# Patient Record
Sex: Male | Born: 1935 | Race: White | Hispanic: No | Marital: Married | Smoking: Former smoker
Health system: Southern US, Community
[De-identification: ages and names within clinical notes are randomized; demographics above are authoritative.]

## PROBLEM LIST (undated history)

## (undated) DIAGNOSIS — Z7901 Long term (current) use of anticoagulants: Secondary | ICD-10-CM

## (undated) DIAGNOSIS — I480 Paroxysmal atrial fibrillation: Secondary | ICD-10-CM

## (undated) DIAGNOSIS — C20 Malignant neoplasm of rectum: Secondary | ICD-10-CM

## (undated) DIAGNOSIS — T3390XA Superficial frostbite of unspecified sites, initial encounter: Secondary | ICD-10-CM

## (undated) HISTORY — PX: HERNIA REPAIR: SHX51

## (undated) HISTORY — DX: Malignant neoplasm of rectum: C20

## (undated) HISTORY — DX: Superficial frostbite of unspecified sites, initial encounter: T33.90XA

## (undated) HISTORY — DX: Paroxysmal atrial fibrillation: I48.0

## (undated) HISTORY — DX: Long term (current) use of anticoagulants: Z79.01

## (undated) HISTORY — PX: OTHER SURGICAL HISTORY: SHX169

---

## 1999-11-04 ENCOUNTER — Ambulatory Visit (HOSPITAL_COMMUNITY): Admission: RE | Admit: 1999-11-04 | Discharge: 1999-11-04 | Payer: Self-pay | Admitting: Endocrinology

## 1999-11-04 ENCOUNTER — Encounter: Payer: Self-pay | Admitting: Endocrinology

## 2001-02-19 ENCOUNTER — Ambulatory Visit (HOSPITAL_COMMUNITY): Admission: RE | Admit: 2001-02-19 | Discharge: 2001-02-19 | Payer: Self-pay | Admitting: Internal Medicine

## 2002-03-12 ENCOUNTER — Ambulatory Visit (HOSPITAL_COMMUNITY): Admission: RE | Admit: 2002-03-12 | Discharge: 2002-03-12 | Payer: Self-pay | Admitting: General Surgery

## 2002-06-26 ENCOUNTER — Ambulatory Visit (HOSPITAL_COMMUNITY): Admission: RE | Admit: 2002-06-26 | Discharge: 2002-06-26 | Payer: Self-pay | Admitting: Ophthalmology

## 2002-07-09 ENCOUNTER — Ambulatory Visit (HOSPITAL_COMMUNITY): Admission: RE | Admit: 2002-07-09 | Discharge: 2002-07-09 | Payer: Self-pay | Admitting: Ophthalmology

## 2002-10-29 ENCOUNTER — Encounter: Payer: Self-pay | Admitting: *Deleted

## 2002-10-29 ENCOUNTER — Encounter (HOSPITAL_COMMUNITY): Admission: RE | Admit: 2002-10-29 | Discharge: 2002-11-28 | Payer: Self-pay | Admitting: *Deleted

## 2003-02-17 ENCOUNTER — Ambulatory Visit (HOSPITAL_COMMUNITY): Admission: RE | Admit: 2003-02-17 | Discharge: 2003-02-17 | Payer: Self-pay | Admitting: *Deleted

## 2004-05-11 ENCOUNTER — Ambulatory Visit: Payer: Self-pay | Admitting: *Deleted

## 2004-06-11 ENCOUNTER — Ambulatory Visit: Payer: Self-pay | Admitting: Cardiology

## 2004-06-30 ENCOUNTER — Ambulatory Visit: Payer: Self-pay | Admitting: Cardiology

## 2004-07-13 ENCOUNTER — Ambulatory Visit: Payer: Self-pay | Admitting: Internal Medicine

## 2004-08-11 ENCOUNTER — Ambulatory Visit: Payer: Self-pay | Admitting: Cardiology

## 2004-09-13 ENCOUNTER — Ambulatory Visit: Payer: Self-pay | Admitting: *Deleted

## 2004-10-11 ENCOUNTER — Ambulatory Visit: Payer: Self-pay | Admitting: *Deleted

## 2004-11-08 ENCOUNTER — Ambulatory Visit: Payer: Self-pay | Admitting: *Deleted

## 2004-12-08 ENCOUNTER — Ambulatory Visit: Payer: Self-pay | Admitting: *Deleted

## 2004-12-15 ENCOUNTER — Ambulatory Visit: Payer: Self-pay | Admitting: Cardiology

## 2005-01-12 ENCOUNTER — Ambulatory Visit: Payer: Self-pay | Admitting: Cardiology

## 2005-02-09 ENCOUNTER — Ambulatory Visit: Payer: Self-pay | Admitting: *Deleted

## 2005-03-01 ENCOUNTER — Ambulatory Visit: Payer: Self-pay | Admitting: Cardiology

## 2005-03-09 ENCOUNTER — Ambulatory Visit: Payer: Self-pay | Admitting: Cardiology

## 2005-03-18 ENCOUNTER — Ambulatory Visit: Payer: Self-pay

## 2005-04-06 ENCOUNTER — Ambulatory Visit: Payer: Self-pay | Admitting: *Deleted

## 2005-05-16 ENCOUNTER — Ambulatory Visit: Payer: Self-pay | Admitting: Cardiology

## 2005-06-14 ENCOUNTER — Ambulatory Visit: Payer: Self-pay | Admitting: *Deleted

## 2005-07-19 ENCOUNTER — Ambulatory Visit: Payer: Self-pay | Admitting: Cardiology

## 2005-08-22 ENCOUNTER — Ambulatory Visit: Payer: Self-pay | Admitting: *Deleted

## 2005-09-19 ENCOUNTER — Ambulatory Visit: Payer: Self-pay | Admitting: Cardiology

## 2005-10-05 ENCOUNTER — Ambulatory Visit: Payer: Self-pay | Admitting: Cardiology

## 2005-10-25 ENCOUNTER — Ambulatory Visit: Payer: Self-pay | Admitting: *Deleted

## 2005-11-22 ENCOUNTER — Ambulatory Visit: Payer: Self-pay | Admitting: *Deleted

## 2006-01-20 ENCOUNTER — Ambulatory Visit: Payer: Self-pay | Admitting: Cardiology

## 2006-02-14 ENCOUNTER — Ambulatory Visit: Payer: Self-pay | Admitting: Cardiology

## 2006-03-20 ENCOUNTER — Ambulatory Visit: Payer: Self-pay | Admitting: Cardiology

## 2006-03-27 ENCOUNTER — Ambulatory Visit: Payer: Self-pay | Admitting: Cardiology

## 2006-05-30 ENCOUNTER — Ambulatory Visit: Payer: Self-pay | Admitting: Cardiology

## 2006-06-27 ENCOUNTER — Ambulatory Visit: Payer: Self-pay | Admitting: Cardiology

## 2006-08-01 ENCOUNTER — Ambulatory Visit: Payer: Self-pay | Admitting: Cardiovascular Disease

## 2006-08-31 ENCOUNTER — Ambulatory Visit: Payer: Self-pay | Admitting: Internal Medicine

## 2006-09-28 ENCOUNTER — Ambulatory Visit: Payer: Self-pay | Admitting: Internal Medicine

## 2006-10-06 ENCOUNTER — Ambulatory Visit: Payer: Self-pay | Admitting: Cardiology

## 2006-11-03 ENCOUNTER — Ambulatory Visit: Payer: Self-pay | Admitting: Internal Medicine

## 2006-11-10 ENCOUNTER — Ambulatory Visit: Payer: Self-pay | Admitting: Cardiology

## 2006-12-26 ENCOUNTER — Ambulatory Visit: Payer: Self-pay | Admitting: Cardiology

## 2007-01-23 ENCOUNTER — Ambulatory Visit: Payer: Self-pay | Admitting: Cardiology

## 2007-02-20 ENCOUNTER — Ambulatory Visit: Payer: Self-pay | Admitting: Cardiology

## 2007-03-14 ENCOUNTER — Ambulatory Visit: Payer: Self-pay | Admitting: Cardiology

## 2007-04-13 ENCOUNTER — Ambulatory Visit: Payer: Self-pay | Admitting: Cardiology

## 2007-05-14 ENCOUNTER — Ambulatory Visit: Payer: Self-pay | Admitting: Cardiology

## 2007-06-14 ENCOUNTER — Ambulatory Visit: Payer: Self-pay | Admitting: Cardiology

## 2007-07-16 ENCOUNTER — Ambulatory Visit: Payer: Self-pay | Admitting: Cardiology

## 2007-08-15 ENCOUNTER — Ambulatory Visit: Payer: Self-pay | Admitting: Cardiovascular Disease

## 2007-09-11 ENCOUNTER — Ambulatory Visit: Payer: Self-pay | Admitting: Cardiology

## 2007-09-11 ENCOUNTER — Ambulatory Visit: Payer: Self-pay | Admitting: Internal Medicine

## 2007-09-26 ENCOUNTER — Ambulatory Visit: Payer: Self-pay | Admitting: Cardiology

## 2007-10-26 ENCOUNTER — Ambulatory Visit: Payer: Self-pay | Admitting: Cardiology

## 2007-11-27 ENCOUNTER — Ambulatory Visit: Payer: Self-pay | Admitting: Cardiology

## 2007-12-25 ENCOUNTER — Ambulatory Visit: Payer: Self-pay | Admitting: Cardiology

## 2008-01-15 ENCOUNTER — Ambulatory Visit: Payer: Self-pay | Admitting: Cardiology

## 2008-02-19 ENCOUNTER — Ambulatory Visit: Payer: Self-pay | Admitting: Cardiology

## 2008-03-19 ENCOUNTER — Ambulatory Visit: Payer: Self-pay | Admitting: Cardiology

## 2008-04-17 ENCOUNTER — Ambulatory Visit: Payer: Self-pay | Admitting: Cardiology

## 2008-05-15 ENCOUNTER — Ambulatory Visit: Payer: Self-pay | Admitting: Cardiology

## 2008-06-16 ENCOUNTER — Ambulatory Visit: Payer: Self-pay | Admitting: Cardiology

## 2008-07-14 ENCOUNTER — Ambulatory Visit: Payer: Self-pay | Admitting: Cardiology

## 2008-10-29 ENCOUNTER — Ambulatory Visit: Payer: Self-pay | Admitting: Cardiology

## 2008-11-06 ENCOUNTER — Ambulatory Visit: Payer: Self-pay | Admitting: Cardiology

## 2008-11-20 ENCOUNTER — Ambulatory Visit: Payer: Self-pay | Admitting: Cardiology

## 2008-11-27 ENCOUNTER — Ambulatory Visit: Payer: Self-pay | Admitting: Cardiology

## 2008-12-04 ENCOUNTER — Ambulatory Visit: Payer: Self-pay | Admitting: Cardiology

## 2008-12-18 ENCOUNTER — Ambulatory Visit: Payer: Self-pay | Admitting: Cardiology

## 2009-01-08 ENCOUNTER — Ambulatory Visit: Payer: Self-pay | Admitting: Cardiology

## 2009-02-18 ENCOUNTER — Ambulatory Visit: Payer: Self-pay | Admitting: Cardiology

## 2009-02-23 ENCOUNTER — Encounter: Payer: Self-pay | Admitting: *Deleted

## 2009-03-01 ENCOUNTER — Ambulatory Visit: Payer: Self-pay | Admitting: Cardiology

## 2009-03-01 ENCOUNTER — Inpatient Hospital Stay (HOSPITAL_COMMUNITY): Admission: EM | Admit: 2009-03-01 | Discharge: 2009-03-04 | Payer: Self-pay | Admitting: Emergency Medicine

## 2009-03-19 ENCOUNTER — Telehealth: Payer: Self-pay | Admitting: Cardiology

## 2009-03-19 ENCOUNTER — Ambulatory Visit: Payer: Self-pay | Admitting: Cardiovascular Disease

## 2009-03-19 ENCOUNTER — Encounter: Payer: Self-pay | Admitting: Cardiology

## 2009-03-19 LAB — CONVERTED CEMR LAB
HCT: 36 %
Hemoglobin: 11.8 g/dL
MCV: 97.3 fL
Platelets: 199 10*3/uL
WBC: 5.4 10*3/uL

## 2009-03-31 DIAGNOSIS — N35919 Unspecified urethral stricture, male, unspecified site: Secondary | ICD-10-CM | POA: Insufficient documentation

## 2009-03-31 DIAGNOSIS — I10 Essential (primary) hypertension: Secondary | ICD-10-CM

## 2009-03-31 DIAGNOSIS — I4891 Unspecified atrial fibrillation: Secondary | ICD-10-CM | POA: Insufficient documentation

## 2009-03-31 DIAGNOSIS — T3390XA Superficial frostbite of unspecified sites, initial encounter: Secondary | ICD-10-CM | POA: Insufficient documentation

## 2009-03-31 DIAGNOSIS — C20 Malignant neoplasm of rectum: Secondary | ICD-10-CM

## 2009-04-01 ENCOUNTER — Ambulatory Visit: Payer: Self-pay | Admitting: Cardiology

## 2009-09-23 ENCOUNTER — Ambulatory Visit: Payer: Self-pay | Admitting: Cardiology

## 2009-10-19 ENCOUNTER — Telehealth: Payer: Self-pay | Admitting: Cardiology

## 2010-05-04 ENCOUNTER — Ambulatory Visit: Payer: Self-pay | Admitting: Cardiology

## 2010-07-07 ENCOUNTER — Telehealth: Payer: Self-pay | Admitting: Cardiology

## 2010-08-10 NOTE — Assessment & Plan Note (Signed)
Summary: 6 mth f/u per checkout on 04/01/09/tg      Allergies Added: NKDA  Visit Type:  Follow-up Primary Provider:  Nehemiah Settle  CC:  no cardiology complaints.  History of Present Illness: Andre Johnson returns today for evaluation and management of his paroxysmal atrial fibrillation. She's had no recurrent symptoms of palpitations, rapid heart beat, presyncope or syncope.  He is no longer on Coumadin because of hematuria which was quite severe with a therapeutic and are 2.5. Extensive evaluation the hospital found no etiology.  His wife takes great care of him and does not want him on Coumadin at all cost. Please are per my previous note.  Current Medications (verified): 1)  Cardizem Cd 240 Mg Xr24h-Cap (Diltiazem Hcl Coated Beads) .... Take 1 Tab Daily 2)  Aspirin Ec 325 Mg Tbec (Aspirin) .... Take One Tablet By Mouth Daily  Allergies (verified): No Known Drug Allergies  Past History:  Past Medical History: Last updated: 04-09-09 Current Problems:  FROSTBITE (ICD-991.3) CARCINOMA, RECTUM (ICD-154.1) HYPERTENSION, BENIGN ESSENTIAL (ICD-401.1) ATRIAL FIBRILLATION, PAROXYSMAL (ICD-427.31) COUMADIN THERAPY (ICD-V58.61) STRICTURE, URETHRAL (ICD-598.9)  Past Surgical History: Last updated: 04/09/2009 colon polpys removed hernia repair  Family History: Last updated: 04-09-09 Father:deceased cause unknown Mother:deceased cause unknown Siblings:  Social History: Last updated: 2009-04-09 Retired  Married  Tobacco Use - Former.  Alcohol Use - no Regular Exercise - no Drug Use - no  Risk Factors: Exercise: no (04/09/2009)  Risk Factors: Smoking Status: quit (04-09-09)  Review of Systems       negative other than history of present illness  Vital Signs:  Patient profile:   77 year old male Height:      68 inches Weight:      111 pounds Pulse rate:   65 / minute BP sitting:   133 / 77  (right arm)  Vitals Entered By: Dreama Saa, CNA (September 23, 2009 3:08 PM)  Physical Exam  General:  Well developed, well nourished, in no acute distress. Head:  normocephalic and atraumatic Eyes:  PERRLA/EOM intact; conjunctiva and lids normal. Neck:  Neck supple, no JVD. No masses, thyromegaly or abnormal cervical nodes. Chest Andre Johnson:  no deformities or breast masses noted Lungs:  Clear bilaterally to auscultation and percussion. Heart:  regular rate and rhythm, normal S1-S2, PMI nondisplaced Msk:  Back normal, normal gait. Muscle strength and tone normal. Pulses:  pulses normal in all 4 extremities Extremities:  No clubbing or cyanosis. Neurologic:  Baseline Skin:  Intact without lesions or rashes. Psych:  Normal affect.   Impression & Recommendations:  Problem # 1:  ATRIAL FIBRILLATION, PAROXYSMAL (ICD-427.31) Assessment Unchanged  His updated medication list for this problem includes:    Aspirin Ec 325 Mg Tbec (Aspirin) .Marland Kitchen... Take one tablet by mouth daily  Problem # 2:  HYPERTENSION, BENIGN ESSENTIAL (ICD-401.1) Assessment: Improved  His updated medication list for this problem includes:    Cardizem Cd 240 Mg Xr24h-cap (Diltiazem hcl coated beads) .Marland Kitchen... Take 1 tab daily    Aspirin Ec 325 Mg Tbec (Aspirin) .Marland Kitchen... Take one tablet by mouth daily  Patient Instructions: 1)  Your physician recommends that you schedule a follow-up appointment in: 6 months 2)  Your physician recommends that you continue on your current medications as directed. Please refer to the Current Medication list given to you today.

## 2010-08-10 NOTE — Assessment & Plan Note (Signed)
Summary: f33m      Allergies Added: NKDA  Visit Type:  Follow-up Primary Andre Johnson:  Andre Johnson  CC:  no cardiology complaints.  History of Present Illness: Andre Johnson comes in today for management of his atrial circulation.  He has no complaints including no chest pain, palpitations, shortness of breath, edema, or syncope.  Current Medications (verified): 1)  Cardizem Cd 240 Mg Xr24h-Cap (Diltiazem Hcl Coated Beads) .... Take 1 Tab Daily 2)  Aspirin Ec 325 Mg Tbec (Aspirin) .... Take One Tablet By Mouth Daily  Allergies (verified): No Known Drug Allergies  Comments:  Nurse/Medical Assistant: per last ov patient states meds are the same only cardizem and asa   Past History:  Past Medical History: Last updated: April 21, 2009 Current Problems:  FROSTBITE (ICD-991.3) CARCINOMA, RECTUM (ICD-154.1) HYPERTENSION, BENIGN ESSENTIAL (ICD-401.1) ATRIAL FIBRILLATION, PAROXYSMAL (ICD-427.31) COUMADIN THERAPY (ICD-V58.61) STRICTURE, URETHRAL (ICD-598.9)  Past Surgical History: Last updated: 04/21/2009 colon polpys removed hernia repair  Family History: Last updated: April 21, 2009 Father:deceased cause unknown Mother:deceased cause unknown Siblings:  Social History: Last updated: 2009-04-21 Retired  Married  Tobacco Use - Former.  Alcohol Use - no Regular Exercise - no Drug Use - no  Risk Factors: Exercise: no (04-21-09)  Risk Factors: Smoking Status: quit (04-21-09)  Review of Systems       negative other than history of present illness  Vital Signs:  Patient profile:   77 year old male Weight:      111 pounds BMI:     16.94 Pulse rate:   57 / minute BP sitting:   136 / 61  (right arm)  Vitals Entered By: Dreama Saa, CNA (May 04, 2010 10:07 AM)  Physical Exam  General:  Well developed, well nourished, in no acute distress. Head:  normocephalic and atraumatic Neck:  Neck supple, no JVD. No masses, thyromegaly or abnormal cervical  nodes. Chest Wall:  no deformities or breast masses noted Lungs:  Clear bilaterally to auscultation and percussion. Heart:  PMI nondisplaced, regular rate and rhythm, bearable S1-S2, no carotid bruits Msk:  decreased ROM.   Pulses:  pulses normal in all 4 extremities Extremities:  No clubbing or cyanosis. Neurologic:  Alert and oriented x 3. Skin:  Intact without lesions or rashes. Psych:  Normal affect.   Impression & Recommendations:  Problem # 1:  ATRIAL FIBRILLATION, PAROXYSMAL (ICD-427.31) Assessment Unchanged He is in atrial fibrillation today. His wife does not want him on anticoagulation. We'll continue with aspirin and rate control. He is asymptomatic. His updated medication list for this problem includes:    Aspirin Ec 325 Mg Tbec (Aspirin) .Marland Kitchen... Take one tablet by mouth daily  Problem # 2:  HYPERTENSION, BENIGN ESSENTIAL (ICD-401.1) Assessment: Improved  His updated medication list for this problem includes:    Cardizem Cd 240 Mg Xr24h-cap (Diltiazem hcl coated beads) .Marland Kitchen... Take 1 tab daily    Aspirin Ec 325 Mg Tbec (Aspirin) .Marland Kitchen... Take one tablet by mouth daily  Patient Instructions: 1)  Your physician recommends that you schedule a follow-up appointment in: 6 months 2)  Your physician recommends that you continue on your current medications as directed. Please refer to the Current Medication list given to you today.

## 2010-08-10 NOTE — Progress Notes (Signed)
Summary: speak to nurse   Phone Note Call from Patient Call back at Home Phone [phone]   Caller: Patient Reason for Call: Talk to Nurse Summary of Call: request to speak to nurse... has some questions Initial call taken by: Migdalia Dk,  October 19, 2009 1:28 PM  Follow-up for Phone Call        PER WIFE NEEDED REFILL ON DILTIAZEM  DONE THORUGH EMR WIFE AWARE. Follow-up by: Scherrie Bateman, LPN,  October 19, 2009 3:15 PM    Prescriptions: CARDIZEM CD 240 MG XR24H-CAP (DILTIAZEM HCL COATED BEADS) take 1 tab daily  #30 x 11   Entered by:   Scherrie Bateman, LPN   Authorized by:   Gaylord Shih, MD, [facility]   Signed by:   Scherrie Bateman, LPN on 96/29/5284   Method used:   Electronically to        [facility] (retail)       [address]       [place], [place]  [address]       Ph: [phone] or [phone]       Fax: [phone]   RxID:   [id number]

## 2010-08-10 NOTE — Progress Notes (Signed)
Summary: refill   Phone Note Refill Request Message from:  Patient on October 19, 2009 9:16 AM  Refills Requested: Medication #1:  CARDIZEM CD 240 MG XR24H-CAP take 1 tab daily   Supply Requested: 1 year [facility] [id number]   Method Requested: Fax to Local Pharmacy Initial call taken by: Migdalia Dk,  October 19, 2009 9:16 AM Caller: Spouse Reason for Call: Talk to Nurse    Prescriptions: CARDIZEM CD 240 MG XR24H-CAP (DILTIAZEM HCL COATED BEADS) take 1 tab daily  #30 x 1   Entered by:   Danielle Rankin, CMA   Authorized by:   Gaylord Shih, MD, [facility]   Signed by:   Teressa Lower RN on 10/19/2009   Method used:   Electronically to        [facility] (retail)       [address]       [place], [place]  [address]       Ph: [phone] or [phone]       Fax: [phone]   RxID:   [id number]

## 2010-08-12 NOTE — Progress Notes (Signed)
Summary: question on meds   Phone Note Call from Patient   Caller: Spouse/alma Reason for Call: Talk to Nurse Summary of Call: pt wife has question on meds Initial call taken by: Roe Coombs,  July 07, 2010 1:08 PM  Follow-up for Phone Call        Wife calls today b/c Andre Johnson passed blood in his urine on Monday.  He has had  no further episodes of hematuria  His last dose of aspirin 325mg  was Sunday 07/04/10.  He will await Dr. Daleen Squibb recommendations regarding Aspirin. Mylo Red RN  Additional Follow-up for Phone Call Additional follow up Details #1::        decrease ASA to 81mg /d, needs to let primary care physician know. Additional Follow-up by: Gaylord Shih, MD, [facility],  July 07, 2010 2:49 PM

## 2010-10-16 LAB — CBC
Hemoglobin: 12.8 g/dL — ABNORMAL LOW (ref 13.0–17.0)
Platelets: 148 10*3/uL — ABNORMAL LOW (ref 150–400)
RBC: 3.74 MIL/uL — ABNORMAL LOW (ref 4.22–5.81)
RDW: 13.5 % (ref 11.5–15.5)
WBC: 3.9 10*3/uL — ABNORMAL LOW (ref 4.0–10.5)

## 2010-10-16 LAB — URINALYSIS, ROUTINE W REFLEX MICROSCOPIC
Bilirubin Urine: NEGATIVE
Ketones, ur: NEGATIVE mg/dL
Leukocytes, UA: NEGATIVE
Nitrite: NEGATIVE
Specific Gravity, Urine: 1.02 (ref 1.005–1.030)
Urobilinogen, UA: 0.2 mg/dL (ref 0.0–1.0)
pH: 6 (ref 5.0–8.0)

## 2010-10-16 LAB — PROTIME-INR
INR: 1.3 (ref 0.00–1.49)
INR: 2 — ABNORMAL HIGH (ref 0.00–1.49)
INR: 2.5 — ABNORMAL HIGH (ref 0.00–1.49)
Prothrombin Time: 16.1 seconds — ABNORMAL HIGH (ref 11.6–15.2)
Prothrombin Time: 22.9 seconds — ABNORMAL HIGH (ref 11.6–15.2)

## 2010-10-16 LAB — HEMOGLOBIN AND HEMATOCRIT, BLOOD
HCT: 32.5 % — ABNORMAL LOW (ref 39.0–52.0)
Hemoglobin: 11.5 g/dL — ABNORMAL LOW (ref 13.0–17.0)

## 2010-10-16 LAB — URINE CULTURE
Colony Count: NO GROWTH
Culture: NO GROWTH

## 2010-10-16 LAB — DIFFERENTIAL
Basophils Absolute: 0 10*3/uL (ref 0.0–0.1)
Basophils Absolute: 0 10*3/uL (ref 0.0–0.1)
Basophils Relative: 1 % (ref 0–1)
Eosinophils Relative: 3 % (ref 0–5)
Lymphocytes Relative: 13 % (ref 12–46)
Lymphocytes Relative: 22 % (ref 12–46)
Lymphs Abs: 0.9 10*3/uL (ref 0.7–4.0)
Monocytes Absolute: 0.3 10*3/uL (ref 0.1–1.0)
Monocytes Relative: 5 % (ref 3–12)
Neutro Abs: 2.6 10*3/uL (ref 1.7–7.7)
Neutro Abs: 4.5 10*3/uL (ref 1.7–7.7)
Neutrophils Relative %: 66 % (ref 43–77)
Neutrophils Relative %: 79 % — ABNORMAL HIGH (ref 43–77)

## 2010-10-16 LAB — BASIC METABOLIC PANEL
Calcium: 8.5 mg/dL (ref 8.4–10.5)
Creatinine, Ser: 1.79 mg/dL — ABNORMAL HIGH (ref 0.4–1.5)
GFR calc Af Amer: 44 mL/min — ABNORMAL LOW (ref >60)
GFR calc non Af Amer: 37 mL/min — ABNORMAL LOW (ref >60)
Sodium: 139 mEq/L (ref 135–145)

## 2010-10-16 LAB — URINE MICROSCOPIC-ADD ON

## 2010-10-16 LAB — APTT: aPTT: 42 seconds — ABNORMAL HIGH (ref 24–37)

## 2010-10-26 ENCOUNTER — Other Ambulatory Visit: Payer: Self-pay | Admitting: *Deleted

## 2010-10-26 ENCOUNTER — Telehealth: Payer: Self-pay | Admitting: Cardiology

## 2010-10-26 MED ORDER — DILTIAZEM HCL ER BEADS 240 MG PO CP24: 240.0000 mg | ORAL_CAPSULE | Freq: Every day | ORAL | Status: DC

## 2010-10-26 NOTE — Telephone Encounter (Signed)
PT IS OUT OF DILTIAZEM AND NEEDS A NEW RX CALLED IN TO [facility]. PLEASE CALL PATIENT WHEN IT HAS BEEN DONE.

## 2010-11-12 ENCOUNTER — Ambulatory Visit (INDEPENDENT_AMBULATORY_CARE_PROVIDER_SITE_OTHER): Payer: Medicare Other | Admitting: Cardiology

## 2010-11-12 ENCOUNTER — Encounter: Payer: Self-pay | Admitting: Cardiology

## 2010-11-12 VITALS — BP 126/79 | HR 61 | Ht 66.0 in | Wt 110.0 lb

## 2010-11-12 DIAGNOSIS — I4891 Unspecified atrial fibrillation: Secondary | ICD-10-CM

## 2010-11-12 NOTE — Progress Notes (Signed)
   Patient ID: Andre Johnson, male    DOB: April 22, 1926, 77 y.o.   MRN: [id number]  HPI Andre Johnson returns for E and M of CAF. He continues to do well without complaint.   Review of Systems  All other systems reviewed and are negative.      Physical Exam  Nursing note and vitals reviewed. Constitutional: He is oriented to person, place, and time. He appears well-developed and well-nourished. No distress.  HENT:  Head: Normocephalic and atraumatic.  Eyes: EOM are normal. Pupils are equal, round, and reactive to light.  Neck: Neck supple. No JVD present. No tracheal deviation present. No thyromegaly present.  Cardiovascular: Normal rate, normal heart sounds and normal pulses.  An irregularly irregular rhythm present. PMI is not displaced.   Pulmonary/Chest: Effort normal and breath sounds normal.  Abdominal: Soft. Bowel sounds are normal.  Musculoskeletal: He exhibits no edema.  Neurological: He is alert and oriented to person, place, and time.       baseline  Skin: Skin is warm and dry.  Psychiatric: He has a normal mood and affect.

## 2010-11-12 NOTE — Assessment & Plan Note (Signed)
Stable, no change in treatment.

## 2010-11-12 NOTE — Patient Instructions (Signed)
Your physician recommends that you continue on your current medications as directed. Please refer to the Current Medication list given to you today.  Your physician recommends that you schedule a follow-up appointment in: 1 year  

## 2010-11-23 NOTE — Group Therapy Note (Signed)
NAMESTIRLING, ORTON                ACCOUNT NO.:  [id number]   MEDICAL RECORD NO.:  [id number]          PATIENT TYPE:  INP   LOCATION:  A329                          FACILITY:  [facility]   PHYSICIAN:  Edward L. Juanetta Gosling, M.D.DATE OF BIRTH:  07/23/1925   DATE OF PROCEDURE:  03/03/2009  DATE OF DISCHARGE:                                 PROGRESS NOTE   Mr. Rambert is overall better.  He looks like his urine is cleared now.  He has no new complaints.  Dr. Marvel Plan help is appreciated.   His exam this morning shows his temperature is 97.8, pulse 58,  respirations 12, blood pressure 115/64, O2 sats 97% on room air.  His  PT/INR is now back basically to normal.   ASSESSMENT:  He has gross hematuria.  He was on Coumadin.  He has had  paroxysmal atrial fibrillation and my plan would be to talk to Dr.  Jerre Simon about whether he is going to need to have a cystoscopy because we  could go ahead and do that now.  He may be able to come off Coumadin in  the future, and I will discuss that with Dr. Dietrich Pates.   PLAN:  Now is to continue with meds and treatments, discussed with  physicians as mentioned and follow.      Edward L. Juanetta Gosling, M.D.  Electronically Signed     ELH/MEDQ  D:  03/03/2009  T:  03/03/2009  Job:  [id number]

## 2010-11-23 NOTE — Group Therapy Note (Signed)
NAMECRIST, KRUSZKA                ACCOUNT NO.:  [id number]   MEDICAL RECORD NO.:  [id number]          PATIENT TYPE:  INP   LOCATION:  A329                          FACILITY:  [facility]   PHYSICIAN:  Edward L. Juanetta Gosling, M.D.DATE OF BIRTH:  [birth year]   DATE OF PROCEDURE:  03/04/2009  DATE OF DISCHARGE:  03/04/2009                                 PROGRESS NOTE   Andre Johnson says he feels well.  His urine seems to be clear now.  He  has no other new complaints.   His exam shows his temperature is 97.8, pulse 59, respirations 14, blood  pressure 115/65, O2 sats 96% on room air.  His chest is clear.  His  heart is regular.  His urine is clear.   ASSESSMENT:  He is better.   PLAN:  He will hopefully have his cystoscopy today and depending on the  results that he might be able to be discharged if he does not need other  treatments.      Edward L. Juanetta Gosling, M.D.  Electronically Signed     ELH/MEDQ  D:  03/04/2009  T:  03/04/2009  Job:  [id number]

## 2010-11-23 NOTE — Consult Note (Signed)
NAMEBRAELEN, Andre Johnson                ACCOUNT NO.:  [id number]   MEDICAL RECORD NO.:  [id number]          PATIENT TYPE:  INP   LOCATION:  A329                          FACILITY:  [facility]   PHYSICIAN:  Andre Friends. Dietrich Pates, Johnson, FACCDATE OF BIRTH:  1926-03-24   DATE OF CONSULTATION:  03/02/2009  DATE OF DISCHARGE:                                 CONSULTATION   PRIMARY CARDIOLOGIST:  Andre Johnson, [facility]   PRIMARY CARE PHYSICIAN:  Andre Johnson   REASON FOR CONSULTATION:  Hematuria on Coumadin in the setting of  paroxysmal atrial fibrillation.   HISTORY OF PRESENT ILLNESS:  This is an 77 year old Caucasian male with  history of paroxysmal atrial fibrillation diagnosed greater than 10  years ago, who is currently on Coumadin, who was admitted with hematuria  that was noticed while he was taking a bath.  The patient had been on  Coumadin and has been followed by our office and was last seen on February 18, 2009.  At that time, his INR was 1.7 and was told to increase the  dose of his Coumadin.  The patient is normally on 5 mg daily except for  Fridays for which she takes 2.5 mg.  However, because of the low INR  status, the patient was asked to increase his Coumadin to 7.5 mg for 2  days and then resume his usual dosing which he had been doing.  The  patient denied any prior history of hematuria.  He denies any abdominal  pain or painful urination.  He did have a CT scan of his abdomen and  pelvis which was negative for any acute abnormality.  The patient's  CHADS score at this time is 1.  The patient is currently resting in bed  without any complaints.  EKG is pending.   REVIEW OF SYSTEMS:  Positive for hematuria, negative for chest pain,  shortness of breath, or palpitations.  All other systems have been  reviewed and are found to be negative other than those listed above.   PAST MEDICAL HISTORY:  1. Paroxysmal atrial fibrillation diagnosed greater than 10 years ago.  Currently, on Coumadin.  a:  At that time, the patient's paroxysmal atrial fibrillation was  diagnosed secondary to hyperthyroidism.  1. Colon cancer.  2. Hypertension.  3. Hyperthyroidism.  4. Mild pulmonary hypertension.  5. Last stress test dated October 13, 2002, negative for ischemia.  EF at      that time was 69%.   SOCIAL HISTORY:  He lives in [place] with his wife.  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  He does not smoke.  No  alcohol.  No drug use.   FAMILY HISTORY:  Unavailable.   CURRENT MEDICATIONS:  Diltiazem 240 mg daily.   ALLERGIES:  No known drug allergies.   CURRENT LABORATORY DATA:  Hemoglobin 12.1, hematocrit 34.9, white blood  cells 3.9, platelets 348.  Sodium 139, potassium 4.8, chloride 109, CO2  28, BUN 29, creatinine 1.7, glucose 107, PT 22.9, INR 2.0.   PHYSICAL EXAMINATION:  VITAL  SIGNS:  Blood pressure 147/77, pulse 57,  respirations 20, temperature 98.4, O2 sat 98% on room air.  GENERAL:  He is awake, alert, oriented, very hard of hearing.  HEENT:  Head is normocephalic and atraumatic.  Eyes, PERRLA.  Mucous  membranes of mouth are pink and moist.  Tongue is midline.  There is no  evidence of bleeding gums.  NECK:  Supple without bruit or JVD noted.  CARDIOVASCULAR:  Regular rate and rhythm with occasional extrasystole  noted without rubs or gallops.  LUNGS:  Clear to auscultation without wheezes, rales, or rhonchi.  ABDOMEN:  Soft, nontender, 2+ bowel sounds.  EXTREMITIES:  Without clubbing, cyanosis, or edema.  NEURO:  Cranial nerves II through XII are grossly intact with the  exception of auditory.  He is very hard of hearing.  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   IMPRESSION:  Paroxysmal atrial fibrillation.  The patient's heart rate  is currently controlled on current medication.  EKG is pending.  The  patient is not on telemetry, but will have this started just to evaluate  for any  evidence of paroxysmal atrial fibrillation during this  hospitalization.  His CHADS score currently is 1.  We agree with  discontinuation of Coumadin at least until etiology of hematuria is  found.  Neurology is apparently to see today and will have  recommendations for that time.  We will follow making more  recommendations per Dr. Dietrich Johnson.   On behalf of the physicians and providers of [facility], we would like to thank the [facility] and Dr.  Juanetta Johnson for allowing [place] to participate in the care of this patient.      Bettey Mare. Lyman Bishop, NP      Andre Friends. Dietrich Pates, Johnson, [facility]  Electronically Signed    KML/MEDQ  D:  03/02/2009  T:  03/03/2009  Job:  [id number]   cc:   Ramon Dredge L. Juanetta Johnson, M.D.  Fax: [phone]

## 2010-11-23 NOTE — H&P (Signed)
Andre Johnson, Andre Johnson                ACCOUNT NO.:  [id number]   MEDICAL RECORD NO.:  [id number]          PATIENT TYPE:  EMS   LOCATION:  ED                            FACILITY:  [facility]   PHYSICIAN:  Mila Homer. Sudie Bailey, M.D.DATE OF BIRTH:  Aug 24, 1925   DATE OF ADMISSION:  03/01/2009  DATE OF DISCHARGE:  LH                              HISTORY & PHYSICAL   This 77 year old man was at home today in the bath when he suddenly  started passing large amounts of blood from his urethra.  His wife tried  to stop the bleeding, but he kept on with this.   He has never had bleeding from the urethra before.  He has been on  Coumadin, however, for quite a number of years for anticoagulation for  paroxysmal atrial fibrillation.  Currently, he is followed by Dr. Kari Baars for his general medical followup with Dr. Cecille Aver for his  Cardiology.   Back over 7 years ago, he was found to have colonic polyps and  apparently he had carcinoma in situ and rectal polyp and was having  colonoscopies through Dr. Lovell Sheehan as noted in electronic record in 2003.  There has been no record about that since then.   Current meds include Coumadin which he uses daily for anticoagulation  and diltiazem ER 240 mg daily for hypertension.   He also has been deaf for some years totally his left ear and partially  in the right, uses a hearing aid and still has problems hearing.  He  notes that he had frostbite of his hands and fingers when he was in  [place] with [employer] back in early 1950s.   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   Currently lives with his wife in [place].  Mentions some type of  blockage he might have had in his neck years ago.   There is no known drug allergies.   There is also history of hernia repair.  There is apparently smoking in  the remote past.  No alcohol or drug use.  No recent air travel.   He has had no abdominal pain.  Again, he has never had urethral  bleeding.   History is given mainly from his wife since the patient is quite deaf  even using his hearing aid.   Admission exam showed a pleasant man appeared to be younger than his  stated age with BP 120/100, pulse 89, respiratory rate 20, temperature  98.7.  He was in no acute distress.  O2 sat was 100%.  When he did talk  to me, his sentence structure was normal and he appeared to be oriented  and alert.  Mucous membranes were moist.  Skin turgor was normal.  He  had no carotid bruits.  The lungs showed decreased breath sounds  throughout, but he was moving air well.  The heart had a regular rhythm  without murmur.  The abdomen was soft without organomegaly or mass or  tenderness.  No edema of the ankles.    Today, his CBC showed a white cell count of 5700, hemoglobin 12.8,  platelet count 161,000.  His BMP showed a glucose of 107, BUN 29,  creatinine 1.79.  UA shows specific gravity 1.020, pH 6, large for  hemoglobin under the microscope, 3-6 wbc's, too numerous to count rbc's  per HPF, rare bacteria.  His PTT was 42 and the INR was 2.5.   ASSESSMENT:  1. Urethral bleeding.  2. Anticoagulation.  3. Paroxysmal atrial fibrillation.  4. Benign essential hypertension.  5. History of rectal carcinoma in situ.  6. History of frostbite.  7. History of some sort of blockage involving the right side of the      neck.   I have discussed his case with the patient, his wife, and sister.  He  will be admitted in the hospital on normal saline IV.  CT of the abdomen  with contrast is pending.  I will give him vitamin K to get his INR down  and will plan for urological evaluation tomorrow, which may include  cystoscopy.  Meanwhile, we will get a urine for UA, C&S.  We will  recheck hemoglobin tonight and in the morning.  His INR will also be  followed.      Mila Homer. Sudie Bailey, M.D.  Electronically Signed      SDK/MEDQ  D:  03/01/2009  T:  03/02/2009  Job:  [id number]

## 2010-11-23 NOTE — Assessment & Plan Note (Signed)
[facility]                       [place] CARDIOLOGY OFFICE NOTE   Andre Johnson, Andre Johnson                         MRN:          [id number]  DATE:04/01/2009                            DOB:          Apr 20, 1926    Mr. Domangue comes in today for further management of his paroxysmal  atrial fibrillation and question of need of continuing anticoagulation.   I was admitted to [facility] on March 01, 2009 with profound  hematuria.   His INR at that time was 2.5.   He had a complete evaluation which showed no obvious etiology of his  bleeding.  He has remained off Coumadin since.  He has had no further  hematuria.   He was initially put on Coumadin years ago for a history of paroxysmal  atrial fibrillation associated with hyperthyroidism.  His [place] score is  2 with hypertension and age.  During his hospitalization, he was seen in  consultation by Dr. Dietrich Pates.   His wife was very frightened by this hematuria and really wants to try  to do without Coumadin.   His current medications include Cardizem CD 240 mg a day for his  hypertension and potential rate control if he goes into atrial fib.  He  is totally asymptomatic.   PHYSICAL EXAMINATION:  VITAL SIGNS:  His blood pressure is 119/69 in the  right arm.  His pulse is 56 and regular.  His EKG confirms sinus brady  with some PACs.  He is 66 inches and 111 pounds.  BMI is only 17.9.  HEENT:  Unchanged.  He has a right subconjunctival hemorrhage.  NECK:  Supple.  Carotid upstrokes were equal bilaterally without bruits.  No JVD.  Thyroid is not enlarged.  LUNGS:  Clear to auscultation and percussion.  HEART:  Slow rate and rhythm with some irregularity.  ABDOMEN:  Soft, good bowel sounds.  EXTREMITIES:  No cyanosis, clubbing, or edema.  Pulses are intact.  NEURO:  Baseline.   I had a long talk with Ms. Speciale and the patient today.  At this  point, we will stay with aspirin 324 mg a day.  The  fact that he had  such frightening hematuria with an INR that was therapeutic is  concerning for a repeat event if we leave him on it.  His [place] score is  2 as I noted with her today.  She knows that if he has any symptomatic  or symptoms of atrial fib, he needs to come in within 48 hours.  I will  see him back in 6 months.    Thomas C. Daleen Squibb, MD, [facility]  Electronically Signed   TCW/MedQ  DD: 04/01/2009  DT: 04/02/2009  Job #: [id number]

## 2010-11-23 NOTE — Assessment & Plan Note (Signed)
[facility]                            CARDIOLOGY OFFICE NOTE   JONTEZ, REDFIELD                         MRN:          [id number]  DATE:09/11/2007                            DOB:          1926-05-14    Ms. Kassel returns today for followup.   PROBLEM LIST:  1. Paroxysmal atrial fibrillation.  He is having no palpitations or      symptomatic atrial fib.  2. Anticoagulation on Coumadin followed by our [facility].  3. Borderline hypertension on Diltiazem extended release 240 mg per      day.  4. Mild pulmonary hypertension by 2-D echo 2006.  5. Dyspnea on exertion, probably secondary to his pulmonary      hypertension.   No complaints today.  His wife says, at times, he is short of breath.   His current meds are Coumadin as directed and Diltiazem extended release  240 mg a day.   Blood pressure today is 150/80, his pulse 60 and regular.  Weight is 118  down 3.  HEENT:  Unchanged.  Alert and oriented x3.  Skin color is normal.  NECK:  Shows no JVD.  Thyroid is not enlarged.  Trachea is midline.  Carotids are equal bilateral without bruits, no thyromegaly.  LUNGS:  Clear to auscultation.  HEART:  Reveals a regular rate and rhythm.  There is no S3.  There is no  right-sided lift.  ABDOMEN:  Soft, good bowel sounds.  No midline bruits.  EXTREMITIES:  No cyanosis, clubbing, or edema.  Pulses are intact.  NEURO:  Exam is intact.   EKG was not repeated today.   Mr. Corkum is doing well.  Will check a Pro Time today.  I will plan on  seeing him back in a year.  He will continue to follow along with his  Pro Time in [place].     Thomas C. Daleen Squibb, MD, [facility]  Electronically Signed    TCW/MedQ  DD: 09/11/2007  DT: 09/11/2007  Job #: [id number]   cc:   Ramon Dredge L. Juanetta Gosling, M.D.

## 2010-11-23 NOTE — Group Therapy Note (Signed)
Andre Johnson, CU                ACCOUNT NO.:  [id number]   MEDICAL RECORD NO.:  [id number]          PATIENT TYPE:  INP   LOCATION:  A329                          FACILITY:  [facility]   PHYSICIAN:  Edward L. Juanetta Gosling, M.D.DATE OF BIRTH:  25-May-1926   DATE OF PROCEDURE:  DATE OF DISCHARGE:                                 PROGRESS NOTE   Mr. Olander is admitted with gross hematuria.  He has reduced the amount  of blood that he has coming from his urethra at this point.  He has no  other new complaints.   His exam this morning shows his temperature is 98.4, pulse 57,  respirations 20, blood pressure 147/77, and O2 sat is 98% on room air.  His hemoglobin stable at 12.1 and white count 3800.  His chest is clear.  His PT/INR 22.8 and 2.   ASSESSMENT:  He has urethral bleeding.  He is on Coumadin.  He is  reduced the amount of blood that he has coming from his urethra now, and  I am overall I think he is better.  We are planning Urology consultation  and then decide what we need to do from there.      Edward L. Juanetta Gosling, M.D.  Electronically Signed     ELH/MEDQ  D:  03/02/2009  T:  03/02/2009  Job:  [id number]

## 2010-11-23 NOTE — Op Note (Signed)
Andre Johnson, CULLEN                ACCOUNT NO.:  [id number]   MEDICAL RECORD NO.:  [id number]          PATIENT TYPE:  INP   LOCATION:  A329                          FACILITY:  [facility]   PHYSICIAN:  Ky Barban, M.D.DATE OF BIRTH:  [birth year]   DATE OF PROCEDURE:  DATE OF DISCHARGE:  03/04/2009                               OPERATIVE REPORT   PREOPERATIVE DIAGNOSIS:  Gross hematuria.   POSTOPERATIVE DIAGNOSIS:  Normal cystoscopy.   PROCEDURE:  Cystoscopy.   DESCRIPTION OF PROCEDURE:  The patient was placed in supine position,  usual prep and drape.  Xylocaine jelly instilled into the urethra after  waiting adequate time.  Flexible cystoscope was introduced under direct  vision.  Anterior urethra looks normal.  Prostatic urethra is open.  No  bladder neck obstruction.  The bladder is 2+ trabeculated.  No evidence  of any tumor, stone, foreign body, or inflammation.  Cystoscope was  removed.  The patient left the operating room in satisfactory condition.      Ky Barban, M.D.  Electronically Signed     MIJ/MEDQ  D:  03/04/2009  T:  03/05/2009  Job:  [id number]   cc:   Ramon Dredge L. Juanetta Gosling, M.D.  Fax: [phone]

## 2010-11-23 NOTE — Discharge Summary (Signed)
Andre Johnson, Andre Johnson                ACCOUNT NO.:  [id number]   MEDICAL RECORD NO.:  [id number]          PATIENT TYPE:  INP   LOCATION:  A329                          FACILITY:  [facility]   PHYSICIAN:  Edward L. Juanetta Gosling, M.D.DATE OF BIRTH:  01/26/1926   DATE OF ADMISSION:  03/01/2009  DATE OF DISCHARGE:  08/25/2010LH                               DISCHARGE SUMMARY   FINAL DISCHARGE DIAGNOSES:  1. Gross hematuria without obvious cause on cystoscopy.  2. Chronic Coumadin therapy.  3. Paroxysmal atrial fibrillation.  4. History of carcinoma in situ.  5. Hypertension.  6. History of frostbite in the hands and fingers.  7. Hard of hearing.   Andre Johnson is an 77 year old, who came to the emergency room because of  gross hematuria.  He was in the bath taking his typical bath that has  had no trauma and then noticed that he was having blood coming from his  urine that he said was passing like urine.  It was coming from his  urethra.  He has not had any probable of this in the past, but he has  been on Coumadin.  He has been on anticoagulation for paroxysmal atrial  fibrillation.  He has had a history of colonic polyps and carcinoma in  situ and has been on Coumadin and diltiazem extended release 240 mg.  He  is also very hard of hearing.   PHYSICAL EXAMINATION:  GENERAL:  He is awake and alert.  VITAL SIGNS:  Blood pressure 120/100, pulse 89, respirations 20,  temperature is 98.7.  HEENT:  His pupils are reactive.  Nose and throat are clear.  NECK:  Supple without masses.  GENITAL:  Normal.  His urine showed too numerous to count red blood  cells.  The PT was 42 with INR of 2.5.   HOSPITAL COURSE:  His Coumadin was held.  He had sequential compression  devices for DVT prophylaxis.  Consultation was obtained with his  cardiologist, who felt that since he had paroxysmal atrial fibrillation  and that he was in sinus rhythm, it was okay to discontinue Coumadin at  least temporarily and maybe  long-term.  He had Urology consultation and  once his prothrombin time was stabilized, he underwent a cystoscopy  which did not reveal a site of bleeding.  Considering that he was  discharged home in improved condition, he is going to be off Coumadin  for 10 days off aspirin for 10 days.  He is going to continue with his  diltiazem CD, and he will follow in my office in about 2 weeks and at  the Cardiology office in about 2 weeks, and when he follows up in  Cardiology office decision will be made whether he should be restarted  on Coumadin or not.      Edward L. Juanetta Gosling, M.D.  Electronically Signed     ELH/MEDQ  D:  03/04/2009  T:  03/05/2009  Job:  [id number]   cc:   Ky Barban, M.D.  Fax: [phone]

## 2010-11-23 NOTE — Assessment & Plan Note (Signed)
[facility]                       [place] CARDIOLOGY OFFICE NOTE   OLON, RUSS                         MRN:          [id number]  DATE:10/29/2008                            DOB:          1926-05-27    Mr. Nettleton comes in today for followup.   PROBLEM LIST:  1. Hyperthyroid-induced paroxysmal atrial fibrillation currently in      sinus rhythm.  2. Anticoagulation.  He has not been as compliant as usual, having not      had his protime checked since January per our nursing staff.  He      reports no bleeding nor does his wife.  3. Borderline hypertension on diltiazem.  4. Mild pulmonary hypertension by echocardiogram in 2006.  5. Dyspnea on exertion probably secondary to his pulmonary      hypertension.   He has no complaints today.  He denies any orthopnea, PND, or peripheral  edema.  He has had no syncope or presyncope.   He is currently on Coumadin and diltiazem extended release 240 mg a day.   PHYSICAL EXAMINATION:  VITAL SIGNS:  His blood pressure today was  160/72, his pulse is 55, and he is in sinus brady.  By EKG with  occasional PAC.  His weight is 113.  HEENT:  Other than poor dentition, he is baseline and normal.  NECK:  Supple.  Carotids upstrokes were equal bilaterally without  bruits.  No JVD.  Thyroid is not enlarged.  Trachea is midline.  LUNGS:  Clear to auscultation and percussion.  HEART:  A regular rate and rhythm.  No murmur, rub, or gallop.  ABDOMEN:  Soft.  Good bowel sounds.  No midline bruit.  EXTREMITIES:  No cyanosis, clubbing, or edema.  Pulses are present.  NEURO:  Grossly intact.   Mr. Mcwherter is stable from our standpoint.  His blood pressure is up  little bit today.  His INR is only 1.3.   PLAN:  1. Strict reinforcement given for followup on protimes on a regular      basis with adjustment of his Coumadin today.  2. Continue diltiazem extended release 240 mg a day.  3. I will see him back in 12  months.   His blood work is being followed according to his wife by Dr. Kari Baars.  We will not do any blood work today.     Thomas C. Daleen Squibb, MD, [facility]  Electronically Signed    TCW/MedQ  DD: 10/29/2008  DT: 10/30/2008  Job #: [id number]   cc:   Ramon Dredge L. Juanetta Gosling, M.D.

## 2010-11-26 NOTE — H&P (Signed)
   NAME:  Andre, Johnson NO.:  [id number]   MEDICAL RECORD NO.:  [id number]                   PATIENT TYPE:   LOCATION:                                       FACILITY:   PHYSICIAN:  Dalia Heading, M.D.               DATE OF BIRTH:   DATE OF ADMISSION:  DATE OF DISCHARGE:                                HISTORY & PHYSICAL   CHIEF COMPLAINT:  History of rectal cancer.   HISTORY OF PRESENT ILLNESS:  The patient is a 77 year old white male status  post multiple colonoscopies in the past for a large rectal mass which  originally was carcinoma in situ.  He has also been found to have multiple  colon polyps.  He now presents for a followup colonoscopy.  His last  colonoscopy was in August 2002.   PAST MEDICAL HISTORY:  Chronic atrial fibrillation and hypertension.   PAST SURGICAL HISTORY:  As noted above, left inguinal herniorrhaphy in 2002.   CURRENT MEDICATIONS:  1. Coumadin.  2. Diltiazem.   ALLERGIES:  No known drug allergies.   REVIEW OF SYSTEMS:  Unremarkable.   PHYSICAL EXAMINATION:  GENERAL:  The patient is a well-developed, well-  nourished white male in no acute distress.  VITAL SIGNS:  He is afebrile.  Vitals signs are stable.  LUNGS:  Clear to auscultation with equal breath sounds bilaterally.  HEART:  An irregularly irregular rhythm without S3, S4, or murmurs.  ABDOMEN:  Benign.  RECTAL:  Examination was deferred to the procedure.   IMPRESSION:  History of rectal carcinoma, colon polyps.    PLAN:  The patient is scheduled to undergo a colonoscopy on March 12, 2002.  The risks and benefits of the procedure including bleeding and  perforation were fully explained to the patient, gave informed consent.  He  is to stop his Coumadin on March 09, 2002.                                               Dalia Heading, M.D.    MAJ/MEDQ  D:  03/05/2002  T:  03/06/2002  Job:  [id number]   cc:   Angus G. Renard Matter, M.D.

## 2010-11-26 NOTE — Assessment & Plan Note (Signed)
[facility]                            CARDIOLOGY OFFICE NOTE   Andre Johnson, Andre Johnson                         MRN:          [id number]  DATE:10/06/2006                            DOB:          09-Aug-1925    Andre Johnson returns today for management of the following issues:   1. Paroxysmal atrial fibrillation.  He denies any palpitations or      symptomatic atrial fibrillation.  2. Borderline hypertension.  We increased his diltiazem to 240 mg a      day last year.  His pressures are good today.  3. Mild pulmonary hypertension by 2D echocardiogram, 2006.  4. Dyspnea on exertion, probably secondary to #3.  5. Anticoagulation, followed in [place].   There are no complaints today.   MEDICATIONS:  1. Diltiazem XR 240 once a day.  2. Coumadin.   Blood pressure 122/66, his pulse is 52 and regular.  His EKG shows sinus  brady with occasional PAC.  His PR, QRS and QTC intervals are normal.  Weight is 121.  HEENT:  Normocephalic, atraumatic.  PERRLA.  Extraocular movements  intact.  Sclerae are clear.  Carotid upstrokes are equal bilaterally,  without bruits.  No JVD.  Thyroid is not enlarged.  NECK:  Supple.  LUNGS:  Clear to auscultation.  HEART:  Reveals a nondisplaced PMI.  There is a normal S1, S2.  There is  no right ventricular lift.  ABDOMINAL EXAM:  Soft, good bowel sounds.  There is no midline bruit.  There is no hepatomegaly.  EXTREMITIES:  Without cyanosis, clubbing or edema.  Pulses are present.  NEUROLOGIC EXAM:  Intact.   ASSESSMENT AND PLAN:  Andre Johnson is doing well.  I have made no changes  in his program.  I will plan on seeing him back in a year.     Thomas C. Daleen Squibb, MD, [facility]  Electronically Signed    TCW/MedQ  DD: 10/06/2006  DT: 10/06/2006  Job #: [id number]   cc:   Angus G. Renard Matter, MD

## 2010-11-26 NOTE — Procedures (Signed)
   NAME:  Andre Johnson, Andre Johnson                          ACCOUNT NO.:  [id number]   MEDICAL RECORD NO.:  [id number]                   PATIENT TYPE:   LOCATION:                                       FACILITY:  [facility]   PHYSICIAN:  Vida Roller, M.D.                DATE OF BIRTH:  [birth year]   DATE OF PROCEDURE:  DATE OF DISCHARGE:                                    STRESS TEST   INDICATIONS:  The patient is a 77 year old male with no known coronary  artery disease who presented with atypical pain described as left shoulder  and arm pain.  He does have significant cardiac risk factors including age,  male sex, remote tobacco abuse, family history, and history of TIAs.   BASELINE DATA:  EKG shows sinus rhythm with PAC at 56 beats per minute with  nonspecific ST abnormalities.  Blood pressure is 130/78.   31 mg of Adenosine was infused over four minutes.  Cardiolite was injected  at three minutes.  The patient reported chest tightness which resolved in  recovery.  EKG showed frequent PACs, but no ischemic changes.   Final images and results are pending M.D. review.     Amy Mercy Riding, P.A. [facility]                     Vida Roller, M.D.    AB/MEDQ  D:  10/29/2002  T:  10/29/2002  Job:  [id number]

## 2010-12-27 ENCOUNTER — Telehealth: Payer: Self-pay | Admitting: Cardiology

## 2010-12-27 ENCOUNTER — Other Ambulatory Visit: Payer: Self-pay

## 2010-12-27 MED ORDER — DILTIAZEM HCL ER BEADS 240 MG PO CP24: 240.0000 mg | ORAL_CAPSULE | Freq: Every day | ORAL | Status: DC

## 2010-12-27 NOTE — Telephone Encounter (Signed)
[name] states she is waiting for okay to refill Andre Johnson's Diltiazem ER 240mg  / pls return call at above number / tg

## 2011-11-11 ENCOUNTER — Other Ambulatory Visit: Payer: Self-pay | Admitting: *Deleted

## 2011-11-11 MED ORDER — DILTIAZEM HCL ER BEADS 240 MG PO CP24: 240.0000 mg | ORAL_CAPSULE | Freq: Every day | ORAL | Status: DC

## 2011-11-21 ENCOUNTER — Encounter: Payer: Self-pay | Admitting: Cardiology

## 2011-11-21 ENCOUNTER — Ambulatory Visit (INDEPENDENT_AMBULATORY_CARE_PROVIDER_SITE_OTHER): Payer: Medicare Other | Admitting: Cardiology

## 2011-11-21 VITALS — BP 136/74 | HR 54 | Resp 16 | Ht 68.0 in | Wt 104.0 lb

## 2011-11-21 DIAGNOSIS — I10 Essential (primary) hypertension: Secondary | ICD-10-CM

## 2011-11-21 DIAGNOSIS — I4891 Unspecified atrial fibrillation: Secondary | ICD-10-CM

## 2011-11-21 NOTE — Patient Instructions (Signed)
Your physician recommends that you schedule a follow-up appointment in: 1 year  

## 2011-11-21 NOTE — Assessment & Plan Note (Signed)
Remains in normal sinus rhythm. Continue conservative management with diltiazem and aspirin. I'll see back in the office in a year.

## 2011-11-21 NOTE — Progress Notes (Signed)
HPI Mr. Andre Johnson comes in today for evaluation and management of his paroxysmal A. fib. As usual, he has no complaints including palpitations, presyncope or syncope, or chest pain.  Compliant with his medications. We are treating him conservatively with aspirin. His wife is afraid for him to take it. He has done well and remains in sinus rhythm today.  Past Medical History  Diagnosis Date  . Frostbite   . Carcinoma of rectum   . Paroxysmal atrial fibrillation   . Anticoagulated on warfarin   . Urethral stricture     Current Outpatient Prescriptions  Medication Sig Dispense Refill  . aspirin 325 MG tablet Take 325 mg by mouth daily.        Marland Kitchen diltiazem (TIAZAC) 240 MG 24 hr capsule Take 1 capsule (240 mg total) by mouth daily.  30 capsule  5    No Known Allergies  No family history on file.  History   Social History  . Marital Status: Married    Spouse Name: N/A    Number of Children: 0  . Years of Education: N/A   Occupational History  . retired    Social History Main Topics  . Smoking status: Former Games developer  . Smokeless tobacco: Never Used  . Alcohol Use: No  . Drug Use: No  . Sexually Active: Not on file   Other Topics Concern  . Not on file   Social History Narrative  . No narrative on file    ROS ALL NEGATIVE EXCEPT THOSE NOTED IN HPI  PE  General Appearance: well developed, well nourished in no acute distress HEENT: symmetrical face, PERRLA, good dentition  Neck: no JVD, thyromegaly, or adenopathy, trachea midline Chest: symmetric without deformity Cardiac: PMI non-displaced, RRR, normal S1, S2, no gallop or murmur Lung: clear to ausculation and percussion Vascular: all pulses full without bruits  Abdominal: nondistended, nontender, good bowel sounds, no HSM, no bruits Extremities: no cyanosis, clubbing or edema, no sign of DVT, no varicosities  Skin: normal color, no rashes Neuro: alert and oriented x 3, non-focal Pysch: normal affect  EKG Normal  sinus rhythm BMET    Component Value Date/Time   NA 139 03/01/2009 1450   K 4.8 03/01/2009 1450   CL 109 03/01/2009 1450   CO2 28 03/01/2009 1450   GLUCOSE 107* 03/01/2009 1450   BUN 29* 03/01/2009 1450   CREATININE 1.79* 03/01/2009 1450   CALCIUM 8.5 03/01/2009 1450   GFRNONAA 37* 03/01/2009 1450   GFRAA  Value: 44        The eGFR has been calculated using the MDRD equation. This calculation has not been validated in all clinical situations. eGFR's persistently <60 mL/min signify possible Chronic Kidney Disease.* 03/01/2009 1450    Lipid Panel  No results found for this basename: chol, trig, hdl, cholhdl, vldl, ldlcalc    CBC    Component Value Date/Time   WBC 5.4 03/19/2009   RBC 3.68* 03/02/2009 0500   HGB 11.8 03/19/2009   HCT 36.0 03/19/2009   PLT 199 03/19/2009   MCV 97.3 03/19/2009   MCHC 34.8 03/02/2009 0500   RDW 13.5 03/02/2009 0500   LYMPHSABS 0.9 03/02/2009 0500   MONOABS 0.3 03/02/2009 0500   EOSABS 0.1 03/02/2009 0500   BASOSABS 0.0 03/02/2009 0500

## 2012-06-27 ENCOUNTER — Telehealth: Payer: Self-pay | Admitting: Cardiology

## 2012-06-27 MED ORDER — DILTIAZEM HCL ER BEADS 240 MG PO CP24: 240.0000 mg | ORAL_CAPSULE | Freq: Every day | ORAL | Status: DC

## 2012-06-27 NOTE — Telephone Encounter (Signed)
New Problem:    Called in needing a refill of the patient's diltiazem (TIAZAC) 240 MG 24 hr capsule.

## 2012-11-20 ENCOUNTER — Ambulatory Visit (INDEPENDENT_AMBULATORY_CARE_PROVIDER_SITE_OTHER): Payer: Medicare Other | Admitting: Cardiology

## 2012-11-20 ENCOUNTER — Encounter: Payer: Self-pay | Admitting: Cardiology

## 2012-11-20 VITALS — BP 137/78 | HR 60 | Ht 68.0 in | Wt 107.0 lb

## 2012-11-20 DIAGNOSIS — I4891 Unspecified atrial fibrillation: Secondary | ICD-10-CM

## 2012-11-20 DIAGNOSIS — I10 Essential (primary) hypertension: Secondary | ICD-10-CM

## 2012-11-20 NOTE — Progress Notes (Signed)
HPI Andre Johnson returns today for evaluation and management his history of paroxysmal atrial fibrillation. He is not on Coumadin. Wife concerned about him taking it. He has been in sinus rhythm during most office visits.  Denies any chest pain, dyspnea on exertion, palpitations, or syncope. He remains very active. Past Medical History  Diagnosis Date  . Frostbite   . Carcinoma of rectum   . Paroxysmal atrial fibrillation   . Anticoagulated on warfarin   . Urethral stricture     Current Outpatient Prescriptions  Medication Sig Dispense Refill  . aspirin 325 MG tablet Take 325 mg by mouth daily.        Marland Kitchen diltiazem (TIAZAC) 240 MG 24 hr capsule Take 1 capsule (240 mg total) by mouth daily.  30 capsule  5   No current facility-administered medications for this visit.    No Known Allergies  History reviewed. No pertinent family history.  History   Social History  . Marital Status: Married    Spouse Name: N/A    Number of Children: 0  . Years of Education: N/A   Occupational History  . retired    Social History Main Topics  . Smoking status: Former Games developer  . Smokeless tobacco: Never Used  . Alcohol Use: No  . Drug Use: No  . Sexually Active: Not on file   Other Topics Concern  . Not on file   Social History Narrative  . No narrative on file    ROS ALL NEGATIVE EXCEPT THOSE NOTED IN HPI  PE  General Appearance: well developed, well nourished in no acute distress HEENT: symmetrical face, PERRLA, good dentition  Neck: no JVD, thyromegaly, or adenopathy, trachea midline Chest: symmetric without deformity Cardiac: PMI non-displaced, RRR, normal S1, S2, no gallop or murmur Lung: clear to ausculation and percussion Vascular: all pulses full without bruits  Abdominal: nondistended, nontender, good bowel sounds, no HSM, no bruits Extremities: no cyanosis, clubbing or edema, no sign of DVT, no varicosities  Skin: normal color, no rashes Neuro: alert and oriented x 3,  non-focal Pysch: normal affect  EKG Not repeated since his rhythm is regular. BMET    Component Value Date/Time   NA 139 03/01/2009 1450   K 4.8 03/01/2009 1450   CL 109 03/01/2009 1450   CO2 28 03/01/2009 1450   GLUCOSE 107* 03/01/2009 1450   BUN 29* 03/01/2009 1450   CREATININE 1.79* 03/01/2009 1450   CALCIUM 8.5 03/01/2009 1450   GFRNONAA 37* 03/01/2009 1450   GFRAA  Value: 44        The eGFR has been calculated using the MDRD equation. This calculation has not been validated in all clinical situations. eGFR's persistently <60 mL/min signify possible Chronic Kidney Disease.* 03/01/2009 1450    Lipid Panel  No results found for this basename: chol, trig, hdl, cholhdl, vldl, ldlcalc    CBC    Component Value Date/Time   WBC 5.4 03/19/2009   RBC 3.68* 03/02/2009 0500   HGB 11.8 03/19/2009   HCT 36.0 03/19/2009   PLT 199 03/19/2009   MCV 97.3 03/19/2009   MCHC 34.8 03/02/2009 0500   RDW 13.5 03/02/2009 0500   LYMPHSABS 0.9 03/02/2009 0500   MONOABS 0.3 03/02/2009 0500   EOSABS 0.1 03/02/2009 0500   BASOSABS 0.0 03/02/2009 0500

## 2012-11-20 NOTE — Patient Instructions (Addendum)
Your physician wants you to follow-up in: 1 year with Dr. Cooper.  You will receive a reminder letter in the mail two months in advance. If you don't receive a letter, please call our office to schedule the follow-up appointment.  

## 2012-11-20 NOTE — Assessment & Plan Note (Signed)
His rhythm is regular today. I suspect is in sinus rhythm. No changes made. Patient desires to follow with Dr. Excell Seltzer as does his wife from previous excellent interactions with him. We'll arrange.

## 2012-12-25 ENCOUNTER — Telehealth: Payer: Self-pay | Admitting: *Deleted

## 2012-12-25 MED ORDER — DILTIAZEM HCL ER BEADS 240 MG PO CP24: 240.0000 mg | ORAL_CAPSULE | Freq: Every day | ORAL | Status: DC

## 2012-12-25 NOTE — Telephone Encounter (Signed)
Medication sent via escribe.  

## 2013-07-29 ENCOUNTER — Other Ambulatory Visit: Payer: Self-pay

## 2013-07-29 MED ORDER — DILTIAZEM HCL ER BEADS 240 MG PO CP24: 240.0000 mg | ORAL_CAPSULE | Freq: Every day | ORAL | Status: DC

## 2013-12-25 ENCOUNTER — Encounter: Payer: Self-pay | Admitting: Cardiovascular Disease

## 2013-12-25 ENCOUNTER — Ambulatory Visit (INDEPENDENT_AMBULATORY_CARE_PROVIDER_SITE_OTHER): Payer: Medicare Other | Admitting: Cardiovascular Disease

## 2013-12-25 VITALS — BP 151/81 | HR 69 | Ht 68.0 in | Wt 102.1 lb

## 2013-12-25 DIAGNOSIS — I4891 Unspecified atrial fibrillation: Secondary | ICD-10-CM

## 2013-12-25 DIAGNOSIS — I10 Essential (primary) hypertension: Secondary | ICD-10-CM

## 2013-12-25 DIAGNOSIS — I480 Paroxysmal atrial fibrillation: Secondary | ICD-10-CM

## 2013-12-25 NOTE — Patient Instructions (Signed)
Your physician wants you to follow-up in: 1 YEAR with Dr Cooper.  You will receive a reminder letter in the mail two months in advance. If you don't receive a letter, please call our office to schedule the follow-up appointment.  Your physician recommends that you continue on your current medications as directed. Please refer to the Current Medication list given to you today.  

## 2013-12-26 ENCOUNTER — Encounter: Payer: Self-pay | Admitting: Cardiovascular Disease

## 2013-12-26 NOTE — Progress Notes (Signed)
    HPI:  80 year old gentleman presenting for followup evaluation. The patient has been followed by Dr. Verl Blalock for many years. He has paroxysmal atrial fibrillation. He has not been anticoagulated because of frailty.  The patient seems to be doing fine. It's very difficult to obtain any history from him because he is extremely hard of hearing. He denies chest pain, shortness of breath, or heart palpitations.  Outpatient Encounter Prescriptions as of 12/25/2013  Medication Sig  . aspirin 325 MG tablet Take 325 mg by mouth daily.    Marland Kitchen diltiazem (TIAZAC) 240 MG 24 hr capsule Take 1 capsule (240 mg total) by mouth daily.    No Known Allergies  Past Medical History  Diagnosis Date  . Frostbite   . Carcinoma of rectum   . Paroxysmal atrial fibrillation   . Anticoagulated on warfarin   . Urethral stricture     ROS: Negative except as per HPI  BP 151/81  Pulse 69  Ht 5\' 8"  (1.727 m)  Wt 46.321 kg (102 lb 1.9 oz)  BMI 15.53 kg/m2  PHYSICAL EXAM: Pt is alert and oriented, thin, elderly-appearing male in NAD HEENT: normal Neck: JVP - normal, carotids 2+= without bruits Lungs: CTA bilaterally CV: RRR without murmur or gallop Abd: soft, NT, Positive BS, no hepatomegaly Ext: no C/C/E, distal pulses intact and equal Skin: warm/dry no rash  EKG:  Normal sinus rhythm 69 beats per minute, PACs, voltage criteria for LVH.  ASSESSMENT AND PLAN: 80 year old gentleman with paroxysmal atrial fibrillation. He is managed with diltiazem for heart rate control and aspirin for antiplatelet therapy. Considered to be a poor candidate for anticoagulation based on frailty. Will continue his same medical program as he appears to be asymptomatic.  Sherren Mocha 12/26/2013 11:26 PM

## 2014-02-28 ENCOUNTER — Other Ambulatory Visit: Payer: Self-pay | Admitting: Cardiovascular Disease

## 2015-01-02 ENCOUNTER — Encounter: Payer: Self-pay | Admitting: *Deleted

## 2015-01-05 ENCOUNTER — Encounter: Payer: Self-pay | Admitting: Cardiovascular Disease

## 2015-01-05 ENCOUNTER — Ambulatory Visit (INDEPENDENT_AMBULATORY_CARE_PROVIDER_SITE_OTHER): Payer: Medicare HMO | Admitting: Cardiovascular Disease

## 2015-01-05 ENCOUNTER — Ambulatory Visit: Payer: Self-pay | Admitting: Cardiovascular Disease

## 2015-01-05 VITALS — BP 150/76 | HR 54 | Ht 68.0 in | Wt 100.8 lb

## 2015-01-05 DIAGNOSIS — I480 Paroxysmal atrial fibrillation: Secondary | ICD-10-CM

## 2015-01-05 DIAGNOSIS — I10 Essential (primary) hypertension: Secondary | ICD-10-CM | POA: Diagnosis not present

## 2015-01-05 MED ORDER — DILTIAZEM HCL ER COATED BEADS 240 MG PO CP24: 240.0000 mg | ORAL_CAPSULE | Freq: Every day | ORAL | Status: DC

## 2015-01-05 NOTE — Patient Instructions (Signed)

## 2015-01-05 NOTE — Progress Notes (Signed)
Cardiology Office Note Date:  01/05/2015   ID:  Andre Johnson, DOB [birth year], MRN [id number]  PCP:  Alonza Bogus, MD  Cardiologist:  Sherren Mocha, MD    Chief Complaint  Patient presents with  . Atrial Fibrillation    History of Present Illness: Andre Johnson is a 81 y.o. male who presents for follow-up of atrial fibrillation. He has been considered a poor candidate for anticoagulation because of frailty and advanced age. He has been managed with aspirin.  The patient is doing fine. His biggest problem is hearing loss. He has no cardiac symptoms. He specifically denies chest pain, dyspnea, or heart palpitations. He has no leg swelling, orthopnea, or PND. He is here with his wife today and she helps provide history because of his hearing problems.  Past Medical History  Diagnosis Date  . Frostbite   . Carcinoma of rectum   . Paroxysmal atrial fibrillation   . Anticoagulated on warfarin   . Urethral stricture     Past Surgical History  Procedure Laterality Date  . Colon polyps    . Hernia repair      Current Outpatient Prescriptions  Medication Sig Dispense Refill  . aspirin 325 MG tablet Take 325 mg by mouth daily.      Marland Kitchen diltiazem (CARTIA XT) 240 MG 24 hr capsule Take 1 capsule (240 mg total) by mouth daily. 30 capsule 11  . [DISCONTINUED] diltiazem (TIAZAC) 240 MG 24 hr capsule Take 1 capsule (240 mg total) by mouth daily. 30 capsule 6   No current facility-administered medications for this visit.   Allergies:   Review of patient's allergies indicates no known allergies.   Social History:  The patient  reports that he has quit smoking. He has never used smokeless tobacco. He reports that he does not drink alcohol or use illicit drugs.   Family History:  The patient's  Family history is unknown by patient.   ROS:  Please see the history of present illness.  Otherwise, review of systems is positive for hearing loss.  All other systems are reviewed and negative.    PHYSICAL EXAM: VS:  BP 150/76 mmHg  Pulse 54  Ht 5\' 8"  (1.727 m)  Wt 100 lb 12.8 oz (45.723 kg)  BMI 15.33 kg/m2 , BMI Body mass index is 15.33 kg/(m^2). GEN: pleasant elderly male, in no acute distress, very hard of hearing HEENT: normal Neck: no JVD, no masses. No carotid bruits Cardiac: RRR without murmur or gallop                Respiratory:  clear to auscultation bilaterally, normal work of breathing GI: soft, nontender, nondistended, + BS MS: no deformity or atrophy Ext: no pretibial edema Skin: warm and dry, no rash Neuro:  Strength and sensation are intact Psych: euthymic mood, full affect  EKG:  EKG is ordered today. The ekg ordered today shows sinus bradycardia 54 bpm with marked sinus arrhythmia, otherwise within normal limits.  Recent Labs: No results found for requested labs within last 365 days.   Lipid Panel  No results found for: CHOL, TRIG, HDL, CHOLHDL, VLDL, LDLCALC, LDLDIRECT    Wt Readings from Last 3 Encounters:  01/05/15 100 lb 12.8 oz (45.723 kg)  12/25/13 102 lb 1.9 oz (46.321 kg)  11/20/12 107 lb (48.535 kg)    ASSESSMENT AND PLAN: Paroxysmal atrial fibrillation: The patient is maintaining sinus rhythm. He has no symptoms. With his advanced age and physical frailty, plans are to continue with low-dose  aspirin and Cardizem. He seems to be getting along well at this time and I will see him back in one year for follow-up evaluation.  Current medicines are reviewed with the patient today.  The patient does not have concerns regarding medicines.  Labs/ tests ordered today include:   Orders Placed This Encounter  Procedures  . EKG 12-Lead   Disposition:   FU one year  Signed, Sherren Mocha, MD  01/05/2015 5:39 PM    [facility] [address], [place], [place]  [address] Phone: [phone]; Fax: [phone]

## 2016-01-08 ENCOUNTER — Other Ambulatory Visit: Payer: Self-pay | Admitting: Cardiovascular Disease

## 2016-02-02 ENCOUNTER — Other Ambulatory Visit: Payer: Self-pay | Admitting: *Deleted

## 2016-02-02 MED ORDER — DILTIAZEM HCL ER COATED BEADS 240 MG PO CP24: 240.0000 mg | ORAL_CAPSULE | Freq: Every day | ORAL | 6 refills | Status: DC

## 2016-04-18 ENCOUNTER — Encounter: Payer: Self-pay | Admitting: Cardiovascular Disease

## 2016-04-18 ENCOUNTER — Ambulatory Visit (INDEPENDENT_AMBULATORY_CARE_PROVIDER_SITE_OTHER): Payer: Medicare HMO | Admitting: Cardiovascular Disease

## 2016-04-18 VITALS — BP 130/80 | HR 62 | Ht 68.0 in | Wt 97.8 lb

## 2016-04-18 DIAGNOSIS — I10 Essential (primary) hypertension: Secondary | ICD-10-CM | POA: Diagnosis not present

## 2016-04-18 DIAGNOSIS — I480 Paroxysmal atrial fibrillation: Secondary | ICD-10-CM | POA: Diagnosis not present

## 2016-04-18 MED ORDER — DILTIAZEM HCL ER COATED BEADS 240 MG PO CP24: 240.0000 mg | ORAL_CAPSULE | Freq: Every day | ORAL | 11 refills | Status: DC

## 2016-04-18 NOTE — Progress Notes (Signed)
Cardiology Office Note Date:  04/18/2016   ID:  Andre Johnson, DOB 20-Jul-1925, MRN [id number]  PCP:  Alonza Bogus, MD  Cardiologist:  Sherren Mocha, MD    Chief Complaint  Patient presents with  . Atrial Fibrillation     History of Present Illness: Andre Johnson is a 82 y.o. male who presents for follow-up of atrial fibrillation. He has been considered a poor candidate for anticoagulation because of frailty and advanced age. He has been managed with aspirin. He is severely hard of hearing and difficult to communicate with. This is unchanged from last year's evaluation.  The patient is here with his wife today. He has no complaints. He specifically denies chest pain or shortness of breath.  Past Medical History:  Diagnosis Date  . Anticoagulated on warfarin   . Carcinoma of rectum ([facility])   . Frostbite   . Paroxysmal atrial fibrillation ([facility])   . Urethral stricture     Past Surgical History:  Procedure Laterality Date  . colon polyps    . HERNIA REPAIR      Current Outpatient Prescriptions  Medication Sig Dispense Refill  . aspirin 325 MG tablet Take 325 mg by mouth daily.      Marland Kitchen diltiazem (CARDIZEM CD) 240 MG 24 hr capsule Take 1 capsule (240 mg total) by mouth daily. 30 capsule 11   No current facility-administered medications for this visit.     Allergies:   Review of patient's allergies indicates no known allergies.   Social History:  The patient  reports that he has quit smoking. He has never used smokeless tobacco. He reports that he does not drink alcohol or use drugs.   Family History:  The patient's  Family history is unknown by patient.    ROS:  Please see the history of present illness.  All other systems are reviewed and negative.    PHYSICAL EXAM: VS:  BP 130/80   Pulse 62   Ht 5\' 8"  (1.727 m)   Wt 97 lb 12.8 oz (44.4 kg)   BMI 14.87 kg/m  , BMI Body mass index is 14.87 kg/m. GEN: Thin, elderly male, thin, in no acute distress  HEENT: normal    Neck: no JVD, no masses. No carotid bruits Cardiac: RRR without murmur or gallop                Respiratory:  clear to auscultation bilaterally, normal work of breathing GI: soft, nontender, nondistended, + BS MS: no deformity or atrophy  Ext: no pretibial edema, pedal pulses 2+= bilaterally Skin: warm and dry, no rash Neuro:  Strength and sensation are intact Psych: euthymic mood, full affect  EKG:  EKG is ordered today. The ekg ordered today shows NSR 61 bpm, PAC's noted, voltage criteria for LVH  Recent Labs: No results found for requested labs within last 8760 hours.   Lipid Panel  No results found for: CHOL, TRIG, HDL, CHOLHDL, VLDL, LDLCALC, LDLDIRECT    Wt Readings from Last 3 Encounters:  04/18/16 97 lb 12.8 oz (44.4 kg)  01/05/15 100 lb 12.8 oz (45.7 kg)  12/25/13 102 lb 1.9 oz (46.3 kg)    ASSESSMENT AND PLAN: Paroxysmal atrial fibrillation: maintaining sinus rhythm. CHADS-Vasc = 2 (age > 82). He has no hx of HTN, diabetes, vascular disease, prior stroke/TIA, or heart failure. He has been deemed a poor anticoagulation candidate now for many years. Continue ASA. FU in one year. He doesn't verbalize much so it is very difficult to do  a comprehensive evaluation. However, it sounds like his functional capacity is reasonably good at home and he has no complaints at all.  Current medicines are reviewed with the patient today.  The patient does not have concerns regarding medicines.  Labs/ tests ordered today include:   Orders Placed This Encounter  Procedures  . EKG 12-Lead   Disposition:   FU one year  Signed, Sherren Mocha, MD  04/18/2016 5:12 PM    [facility] [place], [place], [place]  [address] Phone: [phone]; Fax: [phone]

## 2016-04-18 NOTE — Patient Instructions (Signed)

## 2017-05-05 ENCOUNTER — Other Ambulatory Visit: Payer: Self-pay | Admitting: *Deleted

## 2017-05-05 ENCOUNTER — Telehealth: Payer: Self-pay | Admitting: Cardiovascular Disease

## 2017-05-05 MED ORDER — DILTIAZEM HCL ER COATED BEADS 240 MG PO CP24: 240.0000 mg | ORAL_CAPSULE | Freq: Every day | ORAL | 0 refills | Status: DC

## 2017-05-05 NOTE — Telephone Encounter (Signed)
New Message   *STAT* If patient is at the pharmacy, call can be transferred to refill team.   1. Which medications need to be refilled? (please list name of each medication and dose if known) Diltiazem 240mg , Aspirin 325mg    2. Which pharmacy/location (including street and city if local pharmacy) is medication to be sent to? [facility]  3. Do they need a 30 day or 90 day supply? [facility]

## 2017-05-05 NOTE — Telephone Encounter (Signed)
I have refilled the diltiazem as requested. Please advise on request for asa 325 mg. Thanks, MI

## 2017-05-09 MED ORDER — ASPIRIN 81 MG PO TABS: 81.0000 mg | ORAL_TABLET | Freq: Every day | ORAL | Status: AC

## 2017-05-09 NOTE — Telephone Encounter (Signed)
Spoke with patient's nephew, who cares for him. Per Dr. Burt Knack, instructed him to decrease ASA to 81 mg daily. He was grateful for call and agrees with treatment plan.

## 2017-05-09 NOTE — Telephone Encounter (Signed)
Yes thanks 

## 2017-07-27 ENCOUNTER — Ambulatory Visit (INDEPENDENT_AMBULATORY_CARE_PROVIDER_SITE_OTHER): Payer: PPO | Admitting: Cardiovascular Disease

## 2017-07-27 ENCOUNTER — Encounter: Payer: Self-pay | Admitting: Cardiovascular Disease

## 2017-07-27 VITALS — BP 140/70 | HR 64 | Ht 68.0 in | Wt 102.1 lb

## 2017-07-27 DIAGNOSIS — I10 Essential (primary) hypertension: Secondary | ICD-10-CM | POA: Diagnosis not present

## 2017-07-27 DIAGNOSIS — I480 Paroxysmal atrial fibrillation: Secondary | ICD-10-CM | POA: Diagnosis not present

## 2017-07-27 MED ORDER — DILTIAZEM HCL ER COATED BEADS 240 MG PO CP24: 240.0000 mg | ORAL_CAPSULE | Freq: Every day | ORAL | 3 refills | Status: AC

## 2017-07-27 NOTE — Progress Notes (Signed)
Cardiology Office Note Date:  07/27/2017   ID:  Andre Johnson, DOB 1925/11/12, MRN [id number]  PCP:  Andre Du, MD  Cardiologist:  Andre Mocha, MD    Chief Complaint  Patient presents with  . Follow-up    1 year  . Atrial Fibrillation     History of Present Illness: Andre Johnson is a 84 y.o. male who presents for follow-up of paroxysmal atrial fibrillation.  He has been considered a poor candidate for anticoagulation with advanced age and frailty.  He is here with his nephew today.  His wife passed away about 6 months ago.  The patient has advanced hearing loss and is always been very difficult to communicate with.  He seems to be doing well and denies any specific cardiac-related complaints.  He denies chest pain, chest pressure, or shortness of breath.  He has had no leg swelling.   Past Medical History:  Diagnosis Date  . Anticoagulated on warfarin   . Carcinoma of rectum ([facility])   . Frostbite   . Paroxysmal atrial fibrillation (HCC)   . Urethral stricture     Past Surgical History:  Procedure Laterality Date  . colon polyps    . HERNIA REPAIR      Current Outpatient Medications  Medication Sig Dispense Refill  . aspirin 81 MG tablet Take 1 tablet (81 mg total) by mouth daily.    Marland Kitchen diltiazem (CARDIZEM CD) 240 MG 24 hr capsule Take 1 capsule (240 mg total) by mouth daily. 90 capsule 3   No current facility-administered medications for this visit.     Allergies:   Patient has no known allergies.   Social History:  The patient  reports that he has quit smoking. he has never used smokeless tobacco. He reports that he does not drink alcohol or use drugs.   Family History:  The patient's  Family history is unknown by patient.    ROS:  Please see the history of present illness.   All other systems are reviewed and negative.    PHYSICAL EXAM: VS:  BP 140/70   Pulse 64   Ht 5\' 8"  (1.727 m)   Wt 102 lb 1.9 oz (46.3 kg)   BMI 15.53 kg/m  , BMI Body mass index  is 15.53 kg/m. GEN: Thin elderly gentleman in no distress, in no acute distress  HEENT: normal  Neck: no JVD, no masses. No carotid bruits Cardiac: RRR without murmur or gallop                Respiratory:  clear to auscultation bilaterally, normal work of breathing GI: soft, nontender, nondistended, + BS MS: no deformity or atrophy  Ext: no pretibial edema, pedal pulses 2+= bilaterally Skin: warm and dry, no rash Neuro:  Strength and sensation are intact Psych: euthymic mood, full affect  EKG:  EKG is ordered today. The ekg ordered today shows normal sinus rhythm with PACs, heart rate 64 bpm, voltage criteria for LVH.  Recent Labs: No results found for requested labs within last 8760 hours.   Lipid Panel  No results found for: CHOL, TRIG, HDL, CHOLHDL, VLDL, LDLCALC, LDLDIRECT    Wt Readings from Last 3 Encounters:  07/27/17 102 lb 1.9 oz (46.3 kg)  04/18/16 97 lb 12.8 oz (44.4 kg)  01/05/15 100 lb 12.8 oz (45.7 kg)     ASSESSMENT AND PLAN: Paroxysmal atrial fibrillation: I reviewed all of his EKGs over the last 6 years and he has been in sinus rhythm and  every tracing.  He continues on aspirin for antiplatelet therapy.  His dose has been decreased to 81 mg daily.  Hypertension: Blood pressure is controlled on long-acting diltiazem.  Overall Mr. Testa remained stable and will be seen back in 1 year unless problems arise.  Current medicines are reviewed with the patient today.  The patient has concerns regarding medicines.  Labs/ tests ordered today include:   Orders Placed This Encounter  Procedures  . EKG 12-Lead   Disposition:   FU one year  Signed, Andre Mocha, MD  07/27/2017 8:53 PM    [facility] [place], [place], [place]  [address] Phone: [phone]; Fax: [phone]

## 2017-07-27 NOTE — Patient Instructions (Signed)

## 2017-11-02 ENCOUNTER — Other Ambulatory Visit (HOSPITAL_COMMUNITY): Payer: Self-pay | Admitting: Pulmonary Disease

## 2017-11-02 ENCOUNTER — Ambulatory Visit (HOSPITAL_COMMUNITY)
Admission: RE | Admit: 2017-11-02 | Discharge: 2017-11-02 | Disposition: A | Discharge status: Home / Self Care | Payer: PPO | Source: Ambulatory Visit | Attending: Pulmonary Disease | Admitting: Pulmonary Disease

## 2017-11-02 DIAGNOSIS — R600 Localized edema: Secondary | ICD-10-CM | POA: Diagnosis not present

## 2017-11-02 DIAGNOSIS — E46 Unspecified protein-calorie malnutrition: Secondary | ICD-10-CM | POA: Diagnosis not present

## 2017-11-02 DIAGNOSIS — I4891 Unspecified atrial fibrillation: Secondary | ICD-10-CM | POA: Diagnosis not present

## 2017-11-02 DIAGNOSIS — I10 Essential (primary) hypertension: Secondary | ICD-10-CM | POA: Diagnosis not present

## 2017-11-02 DIAGNOSIS — M7989 Other specified soft tissue disorders: Secondary | ICD-10-CM

## 2017-11-10 DIAGNOSIS — M545 Low back pain: Secondary | ICD-10-CM | POA: Diagnosis not present

## 2017-11-17 ENCOUNTER — Other Ambulatory Visit (HOSPITAL_COMMUNITY): Payer: Self-pay | Admitting: Pulmonary Disease

## 2017-11-17 DIAGNOSIS — N289 Disorder of kidney and ureter, unspecified: Secondary | ICD-10-CM

## 2017-11-22 ENCOUNTER — Ambulatory Visit (HOSPITAL_COMMUNITY)
Admission: RE | Admit: 2017-11-22 | Discharge: 2017-11-22 | Disposition: A | Discharge status: Home / Self Care | Payer: PPO | Source: Ambulatory Visit | Attending: Pulmonary Disease | Admitting: Pulmonary Disease

## 2017-11-22 DIAGNOSIS — R944 Abnormal results of kidney function studies: Secondary | ICD-10-CM | POA: Diagnosis not present

## 2017-11-22 DIAGNOSIS — N261 Atrophy of kidney (terminal): Secondary | ICD-10-CM | POA: Insufficient documentation

## 2017-11-22 DIAGNOSIS — N289 Disorder of kidney and ureter, unspecified: Secondary | ICD-10-CM

## 2017-12-08 DIAGNOSIS — E875 Hyperkalemia: Secondary | ICD-10-CM | POA: Diagnosis not present

## 2017-12-08 DIAGNOSIS — I10 Essential (primary) hypertension: Secondary | ICD-10-CM | POA: Diagnosis not present

## 2017-12-08 DIAGNOSIS — D649 Anemia, unspecified: Secondary | ICD-10-CM | POA: Diagnosis not present

## 2017-12-08 DIAGNOSIS — R809 Proteinuria, unspecified: Secondary | ICD-10-CM | POA: Diagnosis not present

## 2017-12-08 DIAGNOSIS — N184 Chronic kidney disease, stage 4 (severe): Secondary | ICD-10-CM | POA: Diagnosis not present

## 2018-06-21 ENCOUNTER — Other Ambulatory Visit: Payer: Self-pay

## 2018-06-21 ENCOUNTER — Emergency Department (HOSPITAL_COMMUNITY): Payer: PPO

## 2018-06-21 ENCOUNTER — Encounter (HOSPITAL_COMMUNITY): Payer: Self-pay | Admitting: Emergency Medicine

## 2018-06-21 ENCOUNTER — Inpatient Hospital Stay (HOSPITAL_COMMUNITY)
Admission: EM | Admit: 2018-06-21 | Discharge: 2018-07-11 | DRG: 871 | Disposition: E | Payer: PPO | Attending: Pulmonary Disease | Admitting: Pulmonary Disease

## 2018-06-21 ENCOUNTER — Ambulatory Visit (HOSPITAL_COMMUNITY): Payer: PPO

## 2018-06-21 DIAGNOSIS — J189 Pneumonia, unspecified organism: Secondary | ICD-10-CM | POA: Diagnosis not present

## 2018-06-21 DIAGNOSIS — Z66 Do not resuscitate: Secondary | ICD-10-CM | POA: Diagnosis not present

## 2018-06-21 DIAGNOSIS — J9600 Acute respiratory failure, unspecified whether with hypoxia or hypercapnia: Secondary | ICD-10-CM | POA: Diagnosis present

## 2018-06-21 DIAGNOSIS — I480 Paroxysmal atrial fibrillation: Secondary | ICD-10-CM | POA: Diagnosis present

## 2018-06-21 DIAGNOSIS — I110 Hypertensive heart disease with heart failure: Secondary | ICD-10-CM | POA: Diagnosis not present

## 2018-06-21 DIAGNOSIS — I130 Hypertensive heart and chronic kidney disease with heart failure and stage 1 through stage 4 chronic kidney disease, or unspecified chronic kidney disease: Secondary | ICD-10-CM | POA: Diagnosis present

## 2018-06-21 DIAGNOSIS — N179 Acute kidney failure, unspecified: Secondary | ICD-10-CM

## 2018-06-21 DIAGNOSIS — I5032 Chronic diastolic (congestive) heart failure: Secondary | ICD-10-CM | POA: Diagnosis not present

## 2018-06-21 DIAGNOSIS — R7881 Bacteremia: Secondary | ICD-10-CM | POA: Diagnosis not present

## 2018-06-21 DIAGNOSIS — R531 Weakness: Secondary | ICD-10-CM

## 2018-06-21 DIAGNOSIS — Z79899 Other long term (current) drug therapy: Secondary | ICD-10-CM | POA: Diagnosis not present

## 2018-06-21 DIAGNOSIS — I509 Heart failure, unspecified: Secondary | ICD-10-CM | POA: Diagnosis not present

## 2018-06-21 DIAGNOSIS — A419 Sepsis, unspecified organism: Principal | ICD-10-CM | POA: Diagnosis present

## 2018-06-21 DIAGNOSIS — R6520 Severe sepsis without septic shock: Secondary | ICD-10-CM | POA: Diagnosis not present

## 2018-06-21 DIAGNOSIS — Z85048 Personal history of other malignant neoplasm of rectum, rectosigmoid junction, and anus: Secondary | ICD-10-CM | POA: Diagnosis not present

## 2018-06-21 DIAGNOSIS — Z515 Encounter for palliative care: Secondary | ICD-10-CM | POA: Diagnosis present

## 2018-06-21 DIAGNOSIS — Z8719 Personal history of other diseases of the digestive system: Secondary | ICD-10-CM

## 2018-06-21 DIAGNOSIS — E860 Dehydration: Secondary | ICD-10-CM | POA: Diagnosis not present

## 2018-06-21 DIAGNOSIS — J811 Chronic pulmonary edema: Secondary | ICD-10-CM | POA: Diagnosis not present

## 2018-06-21 DIAGNOSIS — I482 Chronic atrial fibrillation, unspecified: Secondary | ICD-10-CM | POA: Diagnosis not present

## 2018-06-21 DIAGNOSIS — Z7901 Long term (current) use of anticoagulants: Secondary | ICD-10-CM

## 2018-06-21 DIAGNOSIS — Z7982 Long term (current) use of aspirin: Secondary | ICD-10-CM | POA: Diagnosis not present

## 2018-06-21 DIAGNOSIS — R627 Adult failure to thrive: Secondary | ICD-10-CM | POA: Diagnosis present

## 2018-06-21 DIAGNOSIS — Z87891 Personal history of nicotine dependence: Secondary | ICD-10-CM | POA: Diagnosis not present

## 2018-06-21 DIAGNOSIS — B9689 Other specified bacterial agents as the cause of diseases classified elsewhere: Secondary | ICD-10-CM | POA: Diagnosis present

## 2018-06-21 DIAGNOSIS — J90 Pleural effusion, not elsewhere classified: Secondary | ICD-10-CM | POA: Diagnosis not present

## 2018-06-21 DIAGNOSIS — N184 Chronic kidney disease, stage 4 (severe): Secondary | ICD-10-CM | POA: Diagnosis not present

## 2018-06-21 DIAGNOSIS — R4020 Unspecified coma: Secondary | ICD-10-CM | POA: Diagnosis not present

## 2018-06-21 DIAGNOSIS — H9190 Unspecified hearing loss, unspecified ear: Secondary | ICD-10-CM | POA: Diagnosis not present

## 2018-06-21 LAB — COMPREHENSIVE METABOLIC PANEL
ALK PHOS: 152 U/L — AB (ref 38–126)
ALT: 21 U/L (ref 0–44)
AST: 26 U/L (ref 15–41)
Albumin: 4.1 g/dL (ref 3.5–5.0)
Anion gap: 9 (ref 5–15)
BILIRUBIN TOTAL: 0.4 mg/dL (ref 0.3–1.2)
BUN: 70 mg/dL — AB (ref 8–23)
CO2: 22 mmol/L (ref 22–32)
Calcium: 8.1 mg/dL — ABNORMAL LOW (ref 8.9–10.3)
Chloride: 105 mmol/L (ref 98–111)
Creatinine, Ser: 3.03 mg/dL — ABNORMAL HIGH (ref 0.61–1.24)
GFR calc Af Amer: 20 mL/min — ABNORMAL LOW (ref >60)
GFR calc non Af Amer: 17 mL/min — ABNORMAL LOW (ref >60)
Glucose, Bld: 121 mg/dL — ABNORMAL HIGH (ref 70–99)
Potassium: 5.5 mmol/L — ABNORMAL HIGH (ref 3.5–5.1)
Sodium: 136 mmol/L (ref 135–145)
Total Protein: 7.4 g/dL (ref 6.5–8.1)

## 2018-06-21 LAB — CBC
HCT: 32.5 % — ABNORMAL LOW (ref 39.0–52.0)
HEMOGLOBIN: 9.3 g/dL — AB (ref 13.0–17.0)
MCH: 30.9 pg (ref 26.0–34.0)
MCHC: 28.6 g/dL — ABNORMAL LOW (ref 30.0–36.0)
MCV: 108 fL — ABNORMAL HIGH (ref 80.0–100.0)
Platelets: 224 10*3/uL (ref 150–400)
RBC: 3.01 MIL/uL — ABNORMAL LOW (ref 4.22–5.81)
RDW: 14.3 % (ref 11.5–15.5)
WBC: 7.4 10*3/uL (ref 4.0–10.5)
nRBC: 0.9 % — ABNORMAL HIGH (ref 0.0–0.2)

## 2018-06-21 LAB — LACTIC ACID, PLASMA: Lactic Acid, Venous: 1.1 mmol/L (ref 0.5–1.9)

## 2018-06-21 LAB — I-STAT TROPONIN, ED: Troponin i, poc: 0.07 ng/mL (ref 0.00–0.08)

## 2018-06-21 LAB — URINALYSIS, ROUTINE W REFLEX MICROSCOPIC
Bacteria, UA: NONE SEEN
Bilirubin Urine: NEGATIVE
Glucose, UA: NEGATIVE mg/dL
Hgb urine dipstick: NEGATIVE
Ketones, ur: NEGATIVE mg/dL
Nitrite: NEGATIVE
PROTEIN: 30 mg/dL — AB
SPECIFIC GRAVITY, URINE: 1.015 (ref 1.005–1.030)
pH: 5 (ref 5.0–8.0)

## 2018-06-21 LAB — I-STAT CG4 LACTIC ACID, ED: Lactic Acid, Venous: 2.4 mmol/L (ref 0.5–1.9)

## 2018-06-21 LAB — BRAIN NATRIURETIC PEPTIDE: B NATRIURETIC PEPTIDE 5: 1023 pg/mL — AB (ref 0.0–100.0)

## 2018-06-21 MED ORDER — FUROSEMIDE 10 MG/ML IJ SOLN
40.0000 mg | Freq: Once | INTRAMUSCULAR | Status: AC
  Administered 2018-06-21: 40 mg via INTRAVENOUS
  Filled 2018-06-21: qty 4

## 2018-06-21 MED ORDER — SODIUM CHLORIDE 0.9 % IV SOLN
500.0000 mg | INTRAVENOUS | Status: DC
  Filled 2018-06-21 (×3): qty 500

## 2018-06-21 MED ORDER — SODIUM CHLORIDE 0.9 % IV SOLN
1.0000 g | INTRAVENOUS | Status: DC
  Filled 2018-06-21 (×3): qty 10

## 2018-06-21 MED ORDER — SODIUM CHLORIDE 0.9 % IV SOLN
INTRAVENOUS | Status: DC
  Administered 2018-06-22: 03:00:00 via INTRAVENOUS

## 2018-06-21 MED ORDER — SODIUM CHLORIDE 0.9 % IV BOLUS
1000.0000 mL | Freq: Once | INTRAVENOUS | Status: AC
  Administered 2018-06-21: 1000 mL via INTRAVENOUS

## 2018-06-21 MED ORDER — SODIUM CHLORIDE 0.9 % IV SOLN
2.0000 g | Freq: Once | INTRAVENOUS | Status: AC
  Administered 2018-06-21: 2 g via INTRAVENOUS
  Filled 2018-06-21: qty 20

## 2018-06-21 MED ORDER — ENOXAPARIN SODIUM 30 MG/0.3ML ~~LOC~~ SOLN
30.0000 mg | SUBCUTANEOUS | Status: DC
  Administered 2018-06-22: 30 mg via SUBCUTANEOUS
  Filled 2018-06-21: qty 0.3

## 2018-06-21 MED ORDER — ASPIRIN EC 81 MG PO TBEC: 81.0000 mg | DELAYED_RELEASE_TABLET | Freq: Every day | ORAL | Status: DC

## 2018-06-21 MED ORDER — SODIUM CHLORIDE 0.9 % IV SOLN
500.0000 mg | Freq: Once | INTRAVENOUS | Status: AC
  Administered 2018-06-21: 500 mg via INTRAVENOUS
  Filled 2018-06-21: qty 500

## 2018-06-21 MED ORDER — DILTIAZEM HCL ER COATED BEADS 240 MG PO CP24: 240.0000 mg | ORAL_CAPSULE | Freq: Every day | ORAL | Status: DC

## 2018-06-21 NOTE — ED Notes (Addendum)
Paged Darrick Meigs, MD to notify of patient's worsening vital trend. Patient becoming increasing confused. Pulled out IV and is now trying to get out of bed.

## 2018-06-21 NOTE — ED Provider Notes (Signed)
[facility] Emergency Department Provider Note MRN:  [id number]  Arrival date & time: 01-Jul-2018     Chief Complaint   Weakness   History of Present Illness   Andre Johnson is a 84 y.o. year-old male with a history of A. fib, rectal cancer presenting to the ED with chief complaint of generalized weakness.  3 days of generalized weakness.  No other significant complaints.  More confused over the past 12 hours.  I was unable to obtain an accurate HPI, PMH, or ROS due to the patient's altered mental status and deafness.  Review of Systems  Positive for altered mental status, weakness.  Patient's Health History    Past Medical History:  Diagnosis Date  . Anticoagulated on warfarin   . Carcinoma of rectum ([facility])   . Frostbite   . Paroxysmal atrial fibrillation (HCC)   . Urethral stricture     Past Surgical History:  Procedure Laterality Date  . colon polyps    . HERNIA REPAIR      Family History  Family history unknown: Yes    Social History   Socioeconomic History  . Marital status: Married    Spouse name: Not on file  . Number of children: 0  . Years of education: Not on file  . Highest education level: Not on file  Occupational History  . Occupation: retired  Scientific laboratory technician  . Financial resource strain: Not on file  . Food insecurity:    Worry: Not on file    Inability: Not on file  . Transportation needs:    Medical: Not on file    Non-medical: Not on file  Tobacco Use  . Smoking status: Former Research scientist (life sciences)  . Smokeless tobacco: Never Used  Substance and Sexual Activity  . Alcohol use: No  . Drug use: No  . Sexual activity: Not on file  Lifestyle  . Physical activity:    Days per week: Not on file    Minutes per session: Not on file  . Stress: Not on file  Relationships  . Social connections:    Talks on phone: Not on file    Gets together: Not on file    Attends religious service: Not on file    Active member of club or organization:  Not on file    Attends meetings of clubs or organizations: Not on file    Relationship status: Not on file  . Intimate partner violence:    Fear of current or ex partner: Not on file    Emotionally abused: Not on file    Physically abused: Not on file    Forced sexual activity: Not on file  Other Topics Concern  . Not on file  Social History Narrative  . Not on file     Physical Exam  Vital Signs and Nursing Notes reviewed Vitals:   06/29/2018 2300 07/10/2018 2353  BP: (!) 123/58   Pulse:    Resp:    Temp:    SpO2:  100%    CONSTITUTIONAL: Well-appearing, NAD NEURO:  Alert and oriented x 3, no focal deficits EYES:  eyes equal and reactive ENT/NECK:  no LAD, no JVD CARDIO: Regular rate, well-perfused, normal S1 and S2 PULM:  CTAB no wheezing or rhonchi GI/GU:  normal bowel sounds, non-distended, non-tender MSK/SPINE:  No gross deformities, no edema SKIN:  no rash, atraumatic PSYCH:  Appropriate speech and behavior  Diagnostic and Interventional Summary    EKG Interpretation  Date/Time:  Thursday June 21 2018 17:04:21 EST Ventricular Rate:  79 PR Interval:    QRS Duration: 86 QT Interval:  378 QTC Calculation: 434 R Axis:   102 Text Interpretation:  Unknown rhythm, irregular rate Lateral infarct, old Probable anteroseptal infarct, old Confirmed by Gerlene Fee [phone]) on 07/01/2018 5:13:07 PM      Labs Reviewed  CBC - Abnormal; Notable for the following components:      Result Value   RBC 3.01 (*)    Hemoglobin 9.3 (*)    HCT 32.5 (*)    MCV 108.0 (*)    MCHC 28.6 (*)    nRBC 0.9 (*)    All other components within normal limits  COMPREHENSIVE METABOLIC PANEL - Abnormal; Notable for the following components:   Potassium 5.5 (*)    Glucose, Bld 121 (*)    BUN 70 (*)    Creatinine, Ser 3.03 (*)    Calcium 8.1 (*)    Alkaline Phosphatase 152 (*)    GFR calc non Af Amer 17 (*)    GFR calc Af Amer 20 (*)    All other components within normal limits    URINALYSIS, ROUTINE W REFLEX MICROSCOPIC - Abnormal; Notable for the following components:   APPearance HAZY (*)    Protein, ur 30 (*)    Leukocytes, UA TRACE (*)    All other components within normal limits  BRAIN NATRIURETIC PEPTIDE - Abnormal; Notable for the following components:   B Natriuretic Peptide 1,023.0 (*)    All other components within normal limits  I-STAT CG4 LACTIC ACID, ED - Abnormal; Notable for the following components:   Lactic Acid, Venous 2.40 (*)    All other components within normal limits  CULTURE, BLOOD (SINGLE)  LACTIC ACID, PLASMA  STREP PNEUMONIAE URINARY ANTIGEN  CBC  COMPREHENSIVE METABOLIC PANEL  I-STAT TROPONIN, ED    CT HEAD WO CONTRAST  Final Result    DG Chest Port 1 View  Final Result      Medications  aspirin tablet 81 mg (has no administration in time range)  diltiazem (CARDIZEM CD) 24 hr capsule 240 mg (has no administration in time range)  enoxaparin (LOVENOX) injection 30 mg (has no administration in time range)  0.9 %  sodium chloride infusion (has no administration in time range)  cefTRIAXone (ROCEPHIN) 1 g in sodium chloride 0.9 % 100 mL IVPB (has no administration in time range)  azithromycin (ZITHROMAX) 500 mg in sodium chloride 0.9 % 250 mL IVPB (has no administration in time range)  sodium chloride 0.9 % bolus 1,000 mL (0 mLs Intravenous Stopped 07/08/2018 1911)  cefTRIAXone (ROCEPHIN) 2 g in sodium chloride 0.9 % 100 mL IVPB (0 g Intravenous Stopped 06/14/2018 1911)  azithromycin (ZITHROMAX) 500 mg in sodium chloride 0.9 % 250 mL IVPB (0 mg Intravenous Stopped 06/13/2018 2112)  furosemide (LASIX) injection 40 mg (40 mg Intravenous Given 07/02/2018 2118)     Procedures Critical Care  ED Course and Medical Decision Making  I have reviewed the triage vital signs and the nursing notes.  Pertinent labs & imaging results that were available during my care of the patient were reviewed by me and considered in my medical decision making  (see below for details).  Altered mental status, generalized weakness in this 84 year old male.  No focal neurological deficits.  Soft blood pressure in triage, appears dry.  Will provide 1 L normal saline.  Patient cannot hear, but is awake and alert, will follow commands.  Will intermittently speak  and say that he just does not feel good.  Temperature 96 rectally, considering sepsis, metabolic disarray, CNS pathology.  Work-up pending.  Labs reveal mildly elevated lactate, elevated BNP, AKI, chest x-ray concerning for pneumonia versus pulmonary edema.  Admitted to hospital service for further care.  Barth Kirks. [place] Small, [place] [email or web address]  Final Clinical Impressions(s) / ED Diagnoses     ICD-10-CM   1. Acute on chronic congestive heart failure, unspecified heart failure type ([place]) I50.9   2. Weakness R53.1 DG Chest [facility] 1 View    DG Chest Port 1 View  3. Community acquired pneumonia, unspecified laterality J18.9   4. AKI (acute kidney injury) [facility]) N17.9     ED Discharge Orders    None         Maudie Flakes, MD July 08, 2018 0009

## 2018-06-21 NOTE — ED Notes (Signed)
Patient is deaf. Patient communicates best when writing notes on paper and letting patient read note.

## 2018-06-21 NOTE — ED Notes (Signed)
Contact list:  Andre Johnson (nephew): [phone] also is patient's Power of Attorney

## 2018-06-21 NOTE — H&P (Signed)
TRH H&P    Patient Demographics:    Andre Johnson, is a 84 y.o. male  MRN: [id number]  DOB - 1925/08/03  Admit Date - 07/09/2018  Referring MD/NP/PA: Dr. Sedonia Small  Outpatient Primary MD for the patient is Sinda Du, MD  Patient coming from: Home  Chief complaint-generalized weakness   HPI:    Andre Johnson  is a 84 y.o. male, with history of paroxysmal atrial fibrillation not on anticoagulation, carcinoma of the rectum was brought to the hospital with chief complaints of generalized weakness.  Patient is deaf and unable to provide any good history.  History is obtained from patient's family at bedside including his healthcare power of attorney his nephew and nephew's parents. As per nephew patient lives alone and manages well, today when he visited patient's house he noticed he was slumped in the chair and did not respond on touch.  Patient was brought to hospital for further evaluation. In the ED patient was found to have pneumonia on chest x-ray with mild CHF exacerbation. Patient received ceftriaxone and Zithromax. Lab work showed lactic acid 2.40, BNP 1023.0 Creatinine 3.03 last creatinine is from 2010 which was 1.79.  Patient does take Lasix 40 mg p.o. daily. There is no documented history of nausea vomiting or diarrhea. No chest pain, no abdominal pain. No previous history of stroke or seizures.   Review of systems:    As in HPI, rest of review of systems is unobtainable as patient is deaf.    Past History of the following :    Past Medical History:  Diagnosis Date  . Anticoagulated on warfarin   . Carcinoma of rectum ([facility])   . Frostbite   . Paroxysmal atrial fibrillation (HCC)   . Urethral stricture       Past Surgical History:  Procedure Laterality Date  . colon polyps    . HERNIA REPAIR        Social History:      Social History   Tobacco Use  . Smoking status: Former  Research scientist (life sciences)  . Smokeless tobacco: Never Used  Substance Use Topics  . Alcohol use: No       Family History :     Family History  Family history unknown: Yes   Family history is unobtainable due to patient's inability to communicate at this time   Home Medications:   Prior to Admission medications   Medication Sig Start Date End Date Taking? Authorizing Provider  aspirin 81 MG tablet Take 1 tablet (81 mg total) by mouth daily. 05/09/17  Yes Sherren Mocha, MD  diltiazem (CARDIZEM CD) 240 MG 24 hr capsule Take 1 capsule (240 mg total) by mouth daily. 07/27/17 07/22/18  Sherren Mocha, MD  furosemide (LASIX) 40 MG tablet Take 40 mg by mouth daily.    [provider]  diltiazem (TIAZAC) 240 MG 24 hr capsule Take 1 capsule (240 mg total) by mouth daily. 07/29/13 02/28/14  Sherren Mocha, MD     Allergies:    No Known Allergies   Physical Exam:   Vitals  Blood pressure 123/81, pulse 64, temperature 98 F (36.7 C), temperature source Temporal, resp. rate (!) 29, height 5\' 3"  (1.6 m), weight 44.5 kg, SpO2 (!) 81 %.  1.  General: Appears in no acute distress  2. Psychiatric: Not tested  3. Neurologic: No focal neurological deficits, moving all extremities  4. Eyes :  anicteric sclerae, moist conjunctivae with no lid lag. PERRLA.  5. ENMT:  Oropharynx clear with moist mucous membranes and good dentition  6. Neck:  supple, no cervical lymphadenopathy appriciated, No thyromegaly  7. Respiratory : Normal respiratory effort, bibasilar crackles auscultated  8. Cardiovascular : RRR, no gallops, rubs or murmurs, no leg edema  9. Gastrointestinal:  Positive bowel sounds, abdomen soft, non-tender to palpation,no hepatosplenomegaly, no rigidity or guarding       10. Skin:  No cyanosis, normal texture and turgor, no rash, lesions or ulcers  11.Musculoskeletal:  Good muscle tone,  joints appear normal , no effusions,  normal range of motion    Data Review:     CBC Recent Labs  Lab 06/27/2018 1720  WBC 7.4  HGB 9.3*  HCT 32.5*  PLT 224  MCV 108.0*  MCH 30.9  MCHC 28.6*  RDW 14.3   ------------------------------------------------------------------------------------------------------------------  Results for orders placed or performed during the hospital encounter of 06/30/2018 (from the past 48 hour(s))  Urinalysis, Routine w reflex microscopic     Status: Abnormal   Collection Time: 06/20/2018  5:08 PM  Result Value Ref Range   Color, Urine YELLOW YELLOW   APPearance HAZY (A) CLEAR   Specific Gravity, Urine 1.015 1.005 - 1.030   pH 5.0 5.0 - 8.0   Glucose, UA NEGATIVE NEGATIVE mg/dL   Hgb urine dipstick NEGATIVE NEGATIVE   Bilirubin Urine NEGATIVE NEGATIVE   Ketones, ur NEGATIVE NEGATIVE mg/dL   Protein, ur 30 (A) NEGATIVE mg/dL   Nitrite NEGATIVE NEGATIVE   Leukocytes, UA TRACE (A) NEGATIVE   RBC / HPF 0-5 0 - 5 RBC/hpf   WBC, UA 6-10 0 - 5 WBC/hpf   Bacteria, UA NONE SEEN NONE SEEN   Squamous Epithelial / LPF 0-5 0 - 5    Comment: Performed at [facility], [address], [place], [place] [address]  CBC     Status: Abnormal   Collection Time: 07/09/2018  5:20 PM  Result Value Ref Range   WBC 7.4 4.0 - 10.5 K/uL   RBC 3.01 (L) 4.22 - 5.81 MIL/uL   Hemoglobin 9.3 (L) 13.0 - 17.0 g/dL   HCT 32.5 (L) 39.0 - 52.0 %   MCV 108.0 (H) 80.0 - 100.0 fL   MCH 30.9 26.0 - 34.0 pg   MCHC 28.6 (L) 30.0 - 36.0 g/dL   RDW 14.3 11.5 - 15.5 %   Platelets 224 150 - 400 K/uL   nRBC 0.9 (H) 0.0 - 0.2 %    Comment: Performed at [facility], [address] [place], [place] [address]  Comprehensive metabolic panel     Status: Abnormal   Collection Time: 07/02/2018  5:20 PM  Result Value Ref Range   Sodium 136 135 - 145 mmol/L   Potassium 5.5 (H) 3.5 - 5.1 mmol/L   Chloride 105 98 - 111 mmol/L   CO2 22 22 - 32 mmol/L   Glucose, Bld 121 (H) 70 - 99 mg/dL   BUN 70 (H) 8 - 23 mg/dL   Creatinine, Ser 3.03 (H) 0.61 - 1.24 mg/dL   Calcium 8.1 (L) 8.9 -  10.3 mg/dL  Total Protein 7.4 6.5 - 8.1 g/dL   Albumin 4.1 3.5 - 5.0 g/dL   AST 26 15 - 41 U/L   ALT 21 0 - 44 U/L   Alkaline Phosphatase 152 (H) 38 - 126 U/L   Total Bilirubin 0.4 0.3 - 1.2 mg/dL   GFR calc non Af Amer 17 (L) >60 mL/min   GFR calc Af Amer 20 (L) >60 mL/min   Anion gap 9 5 - 15    Comment: Performed at [facility], [address] [place], [place] [address]  Brain natriuretic peptide     Status: Abnormal   Collection Time: 06/18/2018  5:20 PM  Result Value Ref Range   B Natriuretic Peptide 1,023.0 (H) 0.0 - 100.0 pg/mL    Comment: Performed at [facility], [address] [place],  [address]  I-stat troponin, ED     Status: None   Collection Time: 06/29/2018  5:26 PM  Result Value Ref Range   Troponin i, poc 0.07 0.00 - 0.08 ng/mL   Comment 3            Comment: Due to the release kinetics of cTnI, a negative result within the first hours of the onset of symptoms does not rule out myocardial infarction with certainty. If myocardial infarction is still suspected, repeat the test at appropriate intervals.   I-Stat CG4 Lactic Acid, ED     Status: Abnormal   Collection Time: 06/13/2018  5:28 PM  Result Value Ref Range   Lactic Acid, Venous 2.40 (HH) 0.5 - 1.9 mmol/L    Chemistries  Recent Labs  Lab 07/08/2018 1720  NA 136  K 5.5*  CL 105  CO2 22  GLUCOSE 121*  BUN 70*  CREATININE 3.03*  CALCIUM 8.1*  AST 26  ALT 21  ALKPHOS 152*  BILITOT 0.4   ------------------------------------------------------------------------------------------------------------------  ------------------------------------------------------------------------------------------------------------------ GFR: Estimated Creatinine Clearance: 10.2 mL/min (A) (by C-G formula based on SCr of 3.03 mg/dL (H)). Liver Function Tests: Recent Labs  Lab 07/07/2018 1720  AST 26  ALT 21  ALKPHOS 152*  BILITOT 0.4  PROT 7.4  ALBUMIN 4.1     --------------------------------------------------------------------------------------------------------------- Urine analysis:    Component Value Date/Time   COLORURINE YELLOW 06/10/2018 1708   APPEARANCEUR HAZY (A) 06/29/2018 1708   LABSPEC 1.015 06/15/2018 1708   PHURINE 5.0 07/09/2018 1708   GLUCOSEU NEGATIVE 07/05/2018 1708   HGBUR NEGATIVE 06/23/2018 1708   BILIRUBINUR NEGATIVE 07/08/2018 1708   KETONESUR NEGATIVE 06/24/2018 1708   PROTEINUR 30 (A) 06/11/2018 1708   UROBILINOGEN 0.2 03/01/2009 1505   NITRITE NEGATIVE 06/23/2018 1708   LEUKOCYTESUR TRACE (A) 06/28/2018 1708      Imaging Results:    Ct Head Wo Contrast  Result Date: 06/21/2018 CLINICAL DATA:  Altered LOC EXAM: CT HEAD WITHOUT CONTRAST TECHNIQUE: Contiguous axial images were obtained from the base of the skull through the vertex without intravenous contrast. COMPARISON:  None. FINDINGS: Brain: No acute territorial infarction, hemorrhage or intracranial mass. Mildly enlarged right cranial convexity extra-axial CSF density, asymmetric compared to the right. Prominent right posterior fossa extra-axial CSF density. Normal ventricle size. Patchy hypodensity in the white matter consistent with small-vessel ischemic changes. Vascular: No hyperdense vessels.  Carotid vascular calcification Skull: Normal. Negative for fracture or focal lesion. Sinuses/Orbits: No acute finding. Other: None IMPRESSION: 1. No definite CT evidence for acute intracranial abnormality. 2. Asymmetric right cranial convexity thin CSF extra-axial density, possible chronic subdural effusion 3. Atrophy and small vessel ischemic changes of the white  matter. Electronically Signed   By: Donavan Foil M.D.   On: 06/14/2018 18:45   Dg Chest Port 1 View  Result Date: 07/02/2018 CLINICAL DATA:  Generalized weakness and fatigue EXAM: PORTABLE CHEST 1 VIEW COMPARISON:  None. FINDINGS: Small moderate pleural effusions with basilar consolidation. Cardiomegaly  with vascular congestion and moderate pulmonary edema. Aortic atherosclerosis. No pneumothorax. IMPRESSION: Cardiomegaly with vascular congestion, moderate pulmonary edema and small moderate bilateral pleural effusions. Dense bibasilar atelectasis or pneumonia. Electronically Signed   By: Donavan Foil M.D.   On: 06/16/2018 17:26    My personal review of EKG: Rhythm atrial fibrillation   Assessment & Plan:    Active Problems:   CAP (community acquired pneumonia)   1. Sepsis due to community-acquired pneumonia-patient presenting with sepsis physiology, blood pressure is stable.  Also aggressive IV fluids not given as patient blood pressure is stable.  Lactic acid 2.40, will recheck lactic acid.  Continue antibiotics ceftriaxone and Zithromax.  Follow blood culture results.  Will obtain urinary strep pneumo antigen  2. Paroxysmal atrial fibrillation-heart rate is controlled, continue Cardizem.  Patient is not on anticoagulation and is maintained on aspirin as per cardiology note from January this year.  3. CHF-chest x-ray shows cardiomegaly with vascular congestion, moderate pulmonary edema.  Will obtain echocardiogram in a.m.  Lasix is currently on hold due to worsening renal function.  Consider restarting Lasix in a.m.  4. Acute kidney injury on CKD stage IV-Baseline creatinine is not known at this time, last creatinine is from 2010 which was 1.79.  Will hold Lasix for tonight, check BMP in a.m.  If renal function is stable, consider restarting Lasix in a.m.   DVT Prophylaxis-   Lovenox   AM Labs Ordered, also please review Full Orders  Family Communication: Admission, patients condition and plan of care including tests being ordered have been discussed with the patient's nephew and nephews parents at bedside who indicate understanding and agree with the plan and Code Status.  Code Status: DNR, confirmed with patient's nephew who is healthcare power of attorney  Admission status:  Inpatient: Based on patients clinical presentation and evaluation of above clinical data, I have made determination that patient meets Inpatient criteria at this time.  Patient requires more than 2 midnights in the hospital.  He is being admitted with community-acquired pneumonia, CHF exacerbation.  Time spent in minutes : 60 minutes   Oswald Hillock M.D on 07/07/2018 at 8:07 PM  Between 7am to 7pm - Pager - [phone]. After 7pm go to [email or web address] - password [facility]   [facility] - Office  [phone]

## 2018-06-21 NOTE — ED Triage Notes (Signed)
PT c/o generalized weakness and fatigue with no pain over the past 3 days.

## 2018-06-21 NOTE — ED Notes (Addendum)
Respiratory called and stated they would be down shortly to do Bipap

## 2018-06-21 NOTE — ED Notes (Signed)
In and out cathed patient per verbal order by [place], MD. Obtained 30 ml of urine.

## 2018-06-22 ENCOUNTER — Inpatient Hospital Stay (HOSPITAL_COMMUNITY): Payer: PPO

## 2018-06-22 ENCOUNTER — Encounter (HOSPITAL_COMMUNITY): Payer: Self-pay | Admitting: *Deleted

## 2018-06-22 LAB — CBC
HCT: 33.7 % — ABNORMAL LOW (ref 39.0–52.0)
Hemoglobin: 9.3 g/dL — ABNORMAL LOW (ref 13.0–17.0)
MCH: 30.7 pg (ref 26.0–34.0)
MCHC: 27.6 g/dL — ABNORMAL LOW (ref 30.0–36.0)
MCV: 111.2 fL — ABNORMAL HIGH (ref 80.0–100.0)
PLATELETS: 249 10*3/uL (ref 150–400)
RBC: 3.03 MIL/uL — ABNORMAL LOW (ref 4.22–5.81)
RDW: 14.2 % (ref 11.5–15.5)
WBC: 15.3 10*3/uL — ABNORMAL HIGH (ref 4.0–10.5)
nRBC: 0.9 % — ABNORMAL HIGH (ref 0.0–0.2)

## 2018-06-22 LAB — COMPREHENSIVE METABOLIC PANEL
ALBUMIN: 3.5 g/dL (ref 3.5–5.0)
ALT: 45 U/L — ABNORMAL HIGH (ref 0–44)
AST: 57 U/L — ABNORMAL HIGH (ref 15–41)
Alkaline Phosphatase: 159 U/L — ABNORMAL HIGH (ref 38–126)
Anion gap: 5 (ref 5–15)
BUN: 73 mg/dL — ABNORMAL HIGH (ref 8–23)
CHLORIDE: 108 mmol/L (ref 98–111)
CO2: 26 mmol/L (ref 22–32)
Calcium: 8 mg/dL — ABNORMAL LOW (ref 8.9–10.3)
Creatinine, Ser: 3.55 mg/dL — ABNORMAL HIGH (ref 0.61–1.24)
GFR calc Af Amer: 17 mL/min — ABNORMAL LOW (ref >60)
GFR calc non Af Amer: 14 mL/min — ABNORMAL LOW (ref >60)
Glucose, Bld: 198 mg/dL — ABNORMAL HIGH (ref 70–99)
Potassium: 6.6 mmol/L (ref 3.5–5.1)
SODIUM: 139 mmol/L (ref 135–145)
Total Bilirubin: 0.5 mg/dL (ref 0.3–1.2)
Total Protein: 6.3 g/dL — ABNORMAL LOW (ref 6.5–8.1)

## 2018-06-22 LAB — MRSA PCR SCREENING: MRSA by PCR: NEGATIVE

## 2018-06-22 LAB — STREP PNEUMONIAE URINARY ANTIGEN: Strep Pneumo Urinary Antigen: NEGATIVE

## 2018-06-22 MED ORDER — GLYCOPYRROLATE 0.2 MG/ML IJ SOLN: 0.1000 mg | Freq: Four times a day (QID) | INTRAMUSCULAR | Status: DC | PRN

## 2018-06-22 MED ORDER — DEXTROSE 50 % IV SOLN: 1.0000 | Freq: Once | INTRAVENOUS | Status: DC

## 2018-06-22 MED ORDER — SODIUM POLYSTYRENE SULFONATE 15 GM/60ML PO SUSP: 30.0000 g | Freq: Once | ORAL | Status: DC

## 2018-06-22 MED ORDER — HYDROMORPHONE HCL 1 MG/ML IJ SOLN
0.5000 mg | INTRAMUSCULAR | Status: DC | PRN
  Administered 2018-06-22 (×2): 0.5 mg via INTRAVENOUS
  Filled 2018-06-22 (×2): qty 0.5

## 2018-06-22 MED ORDER — SODIUM CHLORIDE 0.9 % IV SOLN: 1.0000 g | Freq: Once | INTRAVENOUS | Status: DC

## 2018-06-22 MED ORDER — MORPHINE SULFATE (PF) 2 MG/ML IV SOLN
2.0000 mg | INTRAVENOUS | Status: DC | PRN
  Administered 2018-06-22 (×2): 2 mg via INTRAVENOUS
  Filled 2018-06-22 (×2): qty 1

## 2018-06-22 MED ORDER — ENSURE ENLIVE PO LIQD: 237.0000 mL | Freq: Two times a day (BID) | ORAL | Status: DC

## 2018-06-22 MED ORDER — INSULIN ASPART 100 UNIT/ML ~~LOC~~ SOLN: 10.0000 [IU] | Freq: Once | SUBCUTANEOUS | Status: DC

## 2018-06-23 NOTE — Progress Notes (Signed)
10-24-26 Patient noted to be quickly deteriorating: bradycardic in the 40's, O2 saturation dropping to 40-50%, respirations 8-10/min. Andre Johnson made aware via phone call.   RN and NT at bedside. Time of death 2242-10-24. Dr. Darrick Meigs made aware.

## 2018-06-25 LAB — BLOOD CULTURE ID PANEL (REFLEXED)
Acinetobacter baumannii: NOT DETECTED
Candida albicans: NOT DETECTED
Candida glabrata: NOT DETECTED
Candida krusei: NOT DETECTED
Candida parapsilosis: NOT DETECTED
Candida tropicalis: NOT DETECTED
Enterobacter cloacae complex: NOT DETECTED
Enterobacteriaceae species: NOT DETECTED
Enterococcus species: NOT DETECTED
Escherichia coli: NOT DETECTED
Haemophilus influenzae: NOT DETECTED
Klebsiella oxytoca: NOT DETECTED
Klebsiella pneumoniae: NOT DETECTED
Listeria monocytogenes: NOT DETECTED
Neisseria meningitidis: NOT DETECTED
PROTEUS SPECIES: NOT DETECTED
Pseudomonas aeruginosa: NOT DETECTED
STAPHYLOCOCCUS SPECIES: NOT DETECTED
STREPTOCOCCUS PNEUMONIAE: NOT DETECTED
Serratia marcescens: NOT DETECTED
Staphylococcus aureus (BCID): NOT DETECTED
Streptococcus agalactiae: NOT DETECTED
Streptococcus pyogenes: NOT DETECTED
Streptococcus species: NOT DETECTED

## 2018-06-25 NOTE — Discharge Summary (Signed)
Physician Discharge Summary  Patient ID: Andre Johnson MRN: [id number] DOB/AGE: 08/01/1925 84 y.o. Primary Care Physician:Gabriele Loveland, Percell Miller, MD Admit date: 06/27/2018 Discharge date: 06/25/2018    Discharge Diagnoses:   Active Problems:   CAP (community acquired pneumonia)   Acute respiratory failure (HCC) Hypertension Chronic atrial fib Chronic diastolic heart failure Dehydration Bacteremia Failure to thrive Deafness Sepsis Allergies as of 06/23/2018   No Known Allergies     Medication List    ASK your doctor about these medications   aspirin 81 MG tablet Take 1 tablet (81 mg total) by mouth daily.   diltiazem 240 MG 24 hr capsule Commonly known as:  CARDIZEM CD Take 1 capsule (240 mg total) by mouth daily.   furosemide 40 MG tablet Commonly known as:  LASIX Take 40 mg by mouth daily.       Discharged Condition: Deceased    Consults: None  Significant Diagnostic Studies: Ct Head Wo Contrast  Result Date: 06/13/2018 CLINICAL DATA:  Altered LOC EXAM: CT HEAD WITHOUT CONTRAST TECHNIQUE: Contiguous axial images were obtained from the base of the skull through the vertex without intravenous contrast. COMPARISON:  None. FINDINGS: Brain: No acute territorial infarction, hemorrhage or intracranial mass. Mildly enlarged right cranial convexity extra-axial CSF density, asymmetric compared to the right. Prominent right posterior fossa extra-axial CSF density. Normal ventricle size. Patchy hypodensity in the white matter consistent with small-vessel ischemic changes. Vascular: No hyperdense vessels.  Carotid vascular calcification Skull: Normal. Negative for fracture or focal lesion. Sinuses/Orbits: No acute finding. Other: None IMPRESSION: 1. No definite CT evidence for acute intracranial abnormality. 2. Asymmetric right cranial convexity thin CSF extra-axial density, possible chronic subdural effusion 3. Atrophy and small vessel ischemic changes of the white matter.  Electronically Signed   By: Donavan Foil M.D.   On: 06/30/2018 18:45   Dg Chest Port 1 View  Result Date: 07/08/2018 CLINICAL DATA:  Generalized weakness and fatigue EXAM: PORTABLE CHEST 1 VIEW COMPARISON:  None. FINDINGS: Small moderate pleural effusions with basilar consolidation. Cardiomegaly with vascular congestion and moderate pulmonary edema. Aortic atherosclerosis. No pneumothorax. IMPRESSION: Cardiomegaly with vascular congestion, moderate pulmonary edema and small moderate bilateral pleural effusions. Dense bibasilar atelectasis or pneumonia. Electronically Signed   By: Donavan Foil M.D.   On: 06/23/2018 17:26    Lab Results: Basic Metabolic Panel: No results for input(s): NA, K, CL, CO2, GLUCOSE, BUN, CREATININE, CALCIUM, MG, PHOS in the last 72 hours. Liver Function Tests: No results for input(s): AST, ALT, ALKPHOS, BILITOT, PROT, ALBUMIN in the last 72 hours.   CBC: No results for input(s): WBC, NEUTROABS, HGB, HCT, MCV, PLT in the last 72 hours.  Recent Results (from the past 240 hour(s))  Culture, blood (single)     Status: None (Preliminary result)   Collection Time: 06/12/2018  5:10 PM  Result Value Ref Range Status   Specimen Description   Final    RIGHT ANTECUBITAL Performed at [facility], [address], [place], [place] [address]    Special Requests   Final    BOTTLES DRAWN AEROBIC AND ANAEROBIC Blood Culture adequate volume Performed at [facility], [address], [place], [place] [address]    Culture  Setup Time   Final    GRAM POSITIVE COCCI Gram Stain Report Called to,Read Back By and Verified With: HARRIS,B. AT 0420 ON 06/25/2018 BY EVA AEROBIC BOTTLE ONLY Performed at [facility] Organism ID to follow [place] ID to follow Performed at [facility], [address], [place], [place] [address]    Culture   Final    NO GROWTH 4 DAYS Performed at [facility], [address] [place], [place] [address]    Report Status  PENDING  Incomplete  MRSA PCR Screening     Status: None   Collection Time: 06-28-2018  1:49 AM  Result Value Ref Range Status   MRSA by PCR NEGATIVE NEGATIVE Final    Comment:        The GeneXpert MRSA Assay (FDA approved for NASAL specimens only), is one component of a comprehensive MRSA colonization surveillance program. It is not intended to diagnose MRSA infection nor to guide or monitor treatment for MRSA infections. Performed at [facility], [address], [place],  [address]      Hospital Course: This is a 84 year old who came to my office on the day of admission and who was found to be weak.  He had been found on the floor earlier by family.  He looked acutely sick and was sent to the emergency department.  In the emergency department he was found to have community-acquired pneumonia and was admitted to stepdown unit.  He appeared to be potentially septic and was treated with fluid resuscitation antibiotics etc.  He deteriorated over the first 12 hours to the point that I had discussion with his nephew who has power of attorney about switching to comfort care and that was done.  He was treated with BiPAP initially switched over to nasal cannula his invasive treatments were discontinued he was given morphine and died on 1212/10/09.  He did have gram-positive bacteremia with positive blood culture that was reported after he passed away  Discharge Exam: Blood pressure (!) 81/36, pulse (!) 57, temperature (!) 93.1 F (33.9 C), temperature source Rectal, resp. rate 16, height 5\' 6"  (1.676 m), weight 49.3 kg, SpO2 98 %. Not applicable  Disposition: Released to funeral home      Signed: Kwanza Cancelliere L   06/25/2018, 7:48 AM

## 2018-06-25 NOTE — Progress Notes (Signed)
CRITICAL VALUE ALERT  Critical Value:  Gram positive cocci in blood culture bottle per Ed Lab Tech  Date & Time Notied:  06/25/18 0422  Provider Notified: [facility] notified due to PT being deceased Gretchen Portela RN)  Orders Received/Actions taken: None at this time

## 2018-06-26 LAB — CULTURE, BLOOD (SINGLE): Special Requests: ADEQUATE

## 2018-07-11 NOTE — Progress Notes (Signed)
This RN called pts contact Andre Johnson about pt declining when he was transferred to the ICU. No answer, left vmail for nephew to call the hospital- phone number given.

## 2018-07-11 NOTE — Progress Notes (Signed)
Nutrition Brief Note  Patient screened positive on the Malnutrition Screening Tool (MST) Report.   Per chart, pt felt to be actively dying by MD. Pt apparently is unresponsive and family is on way to hospital.   Confirmed with nursing that pt is palliative care at this time.   No nutrition assessment warranted.  If nutrition issues arise, please consult RD.   Andre Johnson RD, LDN, CNSC Clinical Nutrition Available Tues-Sat via Pager: [phone] 07-18-2018 12:25 PM

## 2018-07-11 NOTE — Progress Notes (Signed)
Palliative Medicine RN Note: Consult order noted. PMT provider will return to [facility] on Monday Dec 16. If he remains inpatient, he will be evaluated then.  Marjie Skiff Shelene Krage, RN, BSN, [facility] Palliative Medicine Team 08-Jul-2018 9:42 AM Office [phone]

## 2018-07-11 NOTE — Progress Notes (Signed)
Subjective: He is much worse.  Blood pressure has been down he has had to go to 100% oxygen despite BiPAP very shallow respirations and this morning his urine output has been essentially 0.  Renal function has deteriorated and his potassium is elevated.  He had oral Kayexalate ordered which she cannot take.  He is unresponsive even to sternal rub.  Objective: Vital signs in last 24 hours: Temp:  [96 F (35.6 C)-98 F (36.7 C)] 96 F (35.6 C) (12/13 0215) Pulse Rate:  [53-202] 53 (12/13 0303) Resp:  [16-29] 20 (12/13 0115) BP: (94-130)/(52-83) 126/57 (12/13 0303) SpO2:  [81 %-100 %] 96 % (12/13 0305) FiO2 (%):  [75 %-100 %] 100 % (12/13 0305) Weight:  [44.5 kg-49.3 kg] 49.3 kg (12/13 0215) Weight change:  Last BM Date: 06/27/18  Intake/Output from previous day: 12/12 0701 - 12/13 0700 In: 1350 [IV Piggyback:1350] Out: -   PHYSICAL EXAM General appearance: Unresponsive on BiPAP very shallow respirations Resp: rhonchi bilaterally Cardio: He is in atrial fib with fairly slow ventricular response GI: soft, non-tender; bowel sounds normal; no masses,  no organomegaly Extremities: extremities normal, atraumatic, no cyanosis or edema  Lab Results:  Results for orders placed or performed during the hospital encounter of 07/05/2018 (from the past 48 hour(s))  Urinalysis, Routine w reflex microscopic     Status: Abnormal   Collection Time: 07/01/2018  5:08 PM  Result Value Ref Range   Color, Urine YELLOW YELLOW   APPearance HAZY (A) CLEAR   Specific Gravity, Urine 1.015 1.005 - 1.030   pH 5.0 5.0 - 8.0   Glucose, UA NEGATIVE NEGATIVE mg/dL   Hgb urine dipstick NEGATIVE NEGATIVE   Bilirubin Urine NEGATIVE NEGATIVE   Ketones, ur NEGATIVE NEGATIVE mg/dL   Protein, ur 30 (A) NEGATIVE mg/dL   Nitrite NEGATIVE NEGATIVE   Leukocytes, UA TRACE (A) NEGATIVE   RBC / HPF 0-5 0 - 5 RBC/hpf   WBC, UA 6-10 0 - 5 WBC/hpf   Bacteria, UA NONE SEEN NONE SEEN   Squamous Epithelial / LPF 0-5 0 - 5     Comment: Performed at [facility], [address], [place], [place] [address]  Culture, blood (single)     Status: None (Preliminary result)   Collection Time: 06/16/2018  5:10 PM  Result Value Ref Range   Specimen Description RIGHT ANTECUBITAL    Special Requests      BOTTLES DRAWN AEROBIC AND ANAEROBIC Blood Culture adequate volume   Culture      NO GROWTH < 24 HOURS Performed at [facility], [address] [place], [place] [address]    Report Status PENDING   CBC     Status: Abnormal   Collection Time: 06/11/2018  5:20 PM  Result Value Ref Range   WBC 7.4 4.0 - 10.5 K/uL   RBC 3.01 (L) 4.22 - 5.81 MIL/uL   Hemoglobin 9.3 (L) 13.0 - 17.0 g/dL   HCT 32.5 (L) 39.0 - 52.0 %   MCV 108.0 (H) 80.0 - 100.0 fL   MCH 30.9 26.0 - 34.0 pg   MCHC 28.6 (L) 30.0 - 36.0 g/dL   RDW 14.3 11.5 - 15.5 %   Platelets 224 150 - 400 K/uL   nRBC 0.9 (H) 0.0 - 0.2 %    Comment: Performed at [facility], [address], [place],  [address]  Comprehensive metabolic panel     Status: Abnormal   Collection Time: 06/13/2018  5:20 PM  Result Value Ref Range  Sodium 136 135 - 145 mmol/L   Potassium 5.5 (H) 3.5 - 5.1 mmol/L   Chloride 105 98 - 111 mmol/L   CO2 22 22 - 32 mmol/L   Glucose, Bld 121 (H) 70 - 99 mg/dL   BUN 70 (H) 8 - 23 mg/dL   Creatinine, Ser 3.03 (H) 0.61 - 1.24 mg/dL   Calcium 8.1 (L) 8.9 - 10.3 mg/dL   Total Protein 7.4 6.5 - 8.1 g/dL   Albumin 4.1 3.5 - 5.0 g/dL   AST 26 15 - 41 U/L   ALT 21 0 - 44 U/L   Alkaline Phosphatase 152 (H) 38 - 126 U/L   Total Bilirubin 0.4 0.3 - 1.2 mg/dL   GFR calc non Af Amer 17 (L) >60 mL/min   GFR calc Af Amer 20 (L) >60 mL/min   Anion gap 9 5 - 15    Comment: Performed at [facility], [address], [place], [place] [address]  Brain natriuretic peptide     Status: Abnormal   Collection Time: 07/03/2018  5:20 PM  Result Value Ref Range   B Natriuretic Peptide 1,023.0 (H) 0.0 - 100.0 pg/mL    Comment: Performed at [facility], [address] [place], [place] [address]  I-stat troponin, ED     Status: None   Collection Time: 06/10/2018  5:26 PM  Result Value Ref Range   Troponin i, poc 0.07 0.00 - 0.08 ng/mL   Comment 3            Comment: Due to the release kinetics of cTnI, a negative result within the first hours of the onset of symptoms does not rule out myocardial infarction with certainty. If myocardial infarction is still suspected, repeat the test at appropriate intervals.   I-Stat CG4 Lactic Acid, ED     Status: Abnormal   Collection Time: 06/16/2018  5:28 PM  Result Value Ref Range   Lactic Acid, Venous 2.40 (HH) 0.5 - 1.9 mmol/L  Lactic acid, plasma     Status: None   Collection Time: 07/04/2018 10:30 PM  Result Value Ref Range   Lactic Acid, Venous 1.1 0.5 - 1.9 mmol/L    Comment: Performed at [facility], [address] [place], [place] [address]  MRSA PCR Screening     Status: None   Collection Time: July 16, 2018  1:49 AM  Result Value Ref Range   MRSA by PCR NEGATIVE NEGATIVE    Comment:        The GeneXpert MRSA Assay (FDA approved for NASAL specimens only), is one component of a comprehensive MRSA colonization surveillance program. It is not intended to diagnose MRSA infection nor to guide or monitor treatment for MRSA infections. Performed at [facility], [address], [place], [place] [address]   CBC     Status: Abnormal   Collection Time: Jul 16, 2018  4:10 AM  Result Value Ref Range   WBC 15.3 (H) 4.0 - 10.5 K/uL   RBC 3.03 (L) 4.22 - 5.81 MIL/uL   Hemoglobin 9.3 (L) 13.0 - 17.0 g/dL   HCT 33.7 (L) 39.0 - 52.0 %   MCV 111.2 (H) 80.0 - 100.0 fL   MCH 30.7 26.0 - 34.0 pg   MCHC 27.6 (L) 30.0 - 36.0 g/dL   RDW 14.2 11.5 - 15.5 %   Platelets 249 150 - 400 K/uL   nRBC 0.9 (H) 0.0 - 0.2 %    Comment: Performed at [facility], [address], [place], [place] [address]  Comprehensive metabolic panel     Status: Abnormal   Collection Time: 2018/06/29  4:10 AM  Result Value Ref Range   Sodium 139 135 -  145 mmol/L   Potassium 6.6 (HH) 3.5 - 5.1 mmol/L    Comment: CRITICAL RESULT CALLED TO, READ BACK BY AND VERIFIED WITH: HEARN,J AT 5:20AM ON 2018-06-29 BY FESTERMAN,C    Chloride 108 98 - 111 mmol/L   CO2 26 22 - 32 mmol/L   Glucose, Bld 198 (H) 70 - 99 mg/dL   BUN 73 (H) 8 - 23 mg/dL   Creatinine, Ser 3.55 (H) 0.61 - 1.24 mg/dL   Calcium 8.0 (L) 8.9 - 10.3 mg/dL   Total Protein 6.3 (L) 6.5 - 8.1 g/dL   Albumin 3.5 3.5 - 5.0 g/dL   AST 57 (H) 15 - 41 U/L   ALT 45 (H) 0 - 44 U/L   Alkaline Phosphatase 159 (H) 38 - 126 U/L   Total Bilirubin 0.5 0.3 - 1.2 mg/dL   GFR calc non Af Amer 14 (L) >60 mL/min   GFR calc Af Amer 17 (L) >60 mL/min   Anion gap 5 5 - 15    Comment: Performed at [facility], [address], [place], [place] [address]    ABGS No results for input(s): PHART, PO2ART, TCO2, HCO3 in the last 72 hours.  Invalid input(s): PCO2 CULTURES Recent Results (from the past 240 hour(s))  Culture, blood (single)     Status: None (Preliminary result)   Collection Time: 07/02/2018  5:10 PM  Result Value Ref Range Status   Specimen Description RIGHT ANTECUBITAL  Final   Special Requests   Final    BOTTLES DRAWN AEROBIC AND ANAEROBIC Blood Culture adequate volume   Culture   Final    NO GROWTH < 24 HOURS Performed at [facility], [address] [place], [place] [address]    Report Status PENDING  Incomplete  MRSA PCR Screening     Status: None   Collection Time: 06-29-18  1:49 AM  Result Value Ref Range Status   MRSA by PCR NEGATIVE NEGATIVE Final    Comment:        The GeneXpert MRSA Assay (FDA approved for NASAL specimens only), is one component of a comprehensive MRSA colonization surveillance program. It is not intended to diagnose MRSA infection nor to guide or monitor treatment for MRSA infections. Performed at [facility], [address], [place], [place] [address]    Studies/Results: Ct Head Wo Contrast  Result Date: 07/07/2018 CLINICAL DATA:  Altered  LOC EXAM: CT HEAD WITHOUT CONTRAST TECHNIQUE: Contiguous axial images were obtained from the base of the skull through the vertex without intravenous contrast. COMPARISON:  None. FINDINGS: Brain: No acute territorial infarction, hemorrhage or intracranial mass. Mildly enlarged right cranial convexity extra-axial CSF density, asymmetric compared to the right. Prominent right posterior fossa extra-axial CSF density. Normal ventricle size. Patchy hypodensity in the white matter consistent with small-vessel ischemic changes. Vascular: No hyperdense vessels.  Carotid vascular calcification Skull: Normal. Negative for fracture or focal lesion. Sinuses/Orbits: No acute finding. Other: None IMPRESSION: 1. No definite CT evidence for acute intracranial abnormality. 2. Asymmetric right cranial convexity thin CSF extra-axial density, possible chronic subdural effusion 3. Atrophy and small vessel ischemic changes of the white matter. Electronically Signed   By: Donavan Foil M.D.   On: 06/23/2018 18:45   Dg Chest Port 1 View  Result Date: 06/17/2018 CLINICAL DATA:  Generalized weakness and fatigue EXAM: PORTABLE CHEST  1 VIEW COMPARISON:  None. FINDINGS: Small moderate pleural effusions with basilar consolidation. Cardiomegaly with vascular congestion and moderate pulmonary edema. Aortic atherosclerosis. No pneumothorax. IMPRESSION: Cardiomegaly with vascular congestion, moderate pulmonary edema and small moderate bilateral pleural effusions. Dense bibasilar atelectasis or pneumonia. Electronically Signed   By: Donavan Foil M.D.   On: 06/28/2018 17:26    Medications:  Prior to Admission:  Medications Prior to Admission  Medication Sig Dispense Refill Last Dose  . aspirin 81 MG tablet Take 1 tablet (81 mg total) by mouth daily.   Taking  . diltiazem (CARDIZEM CD) 240 MG 24 hr capsule Take 1 capsule (240 mg total) by mouth daily. 90 capsule 3   . furosemide (LASIX) 40 MG tablet Take 40 mg by mouth daily.       Scheduled: . aspirin EC  81 mg Oral Daily  . dextrose  1 ampule Intravenous Once  . diltiazem  240 mg Oral Daily  . enoxaparin (LOVENOX) injection  30 mg Subcutaneous Q24H  . insulin aspart  10 Units Subcutaneous Once  . sodium polystyrene  30 g Rectal Once   Continuous: . sodium chloride 10 mL/hr at 2018-06-27 0249  . azithromycin    . cefTRIAXone (ROCEPHIN)  IV     PRN:  Assesment: I think he has community-acquired pneumonia likely from aspiration although this not clear.  He has respiratory failure.  He is on 100% oxygen and BiPAP with very shallow respirations.  He has clearly deteriorated from last night.  His renal function is worse.  He has essentially no urine output and I am going to have him get an in and out cath just to see if he is making urine.  He is hypothermic potassium level is elevated. Active Problems:   CAP (community acquired pneumonia)   Acute respiratory failure ([place])    Plan: Discussed with his nephew who has power of attorney and told him that I think he is actively dying.  His nephew is on the way.  I will have palliative care involved to help.  Give him D50 and insulin.  Try to keep him comfortable.  Apparently he told the nephew last night that he is "ready to go".  I do not think he will survive the day    LOS: 1 day   Dayzee Trower L June 27, 2018, 7:51 AM

## 2018-07-11 NOTE — Plan of Care (Signed)
Bear hugger applied for hypothermia

## 2018-07-11 NOTE — Progress Notes (Signed)
Patient successfully transported from ED to ICU on BIPAP/vent. Report given to ICU RT.

## 2018-07-11 NOTE — Plan of Care (Signed)
Patient possibly moving to palliative care this afternoon. Family at bedside now and understands patient's health status at this time.

## 2018-07-11 DEATH — deceased

## 2020-01-08 IMAGING — CT CT HEAD W/O CM
3 series · 16 of 46 positions shown, 19 images · non-contrast
Comparison: None.

CLINICAL DATA: Altered LOC

EXAM:
CT HEAD WITHOUT CONTRAST
TECHNIQUE: Contiguous axial images were obtained from the base of the skull
through the vertex without intravenous contrast.

[Series 2: head wo · axial · 0.42mm/px · z∈[+87,+207]mm · 10 of 29 slices shown, 13 images]
[im 3/29  brain]
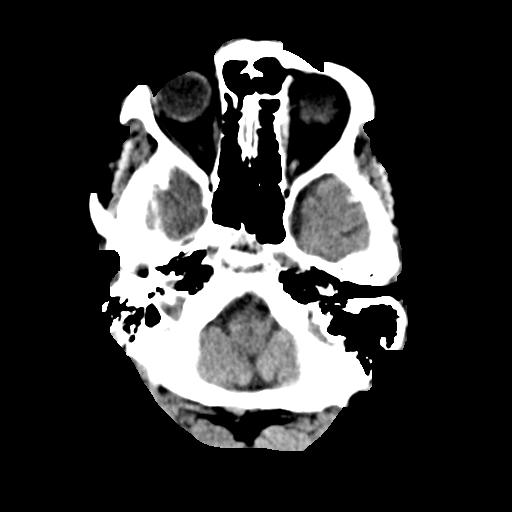
[im 3/29  bone]
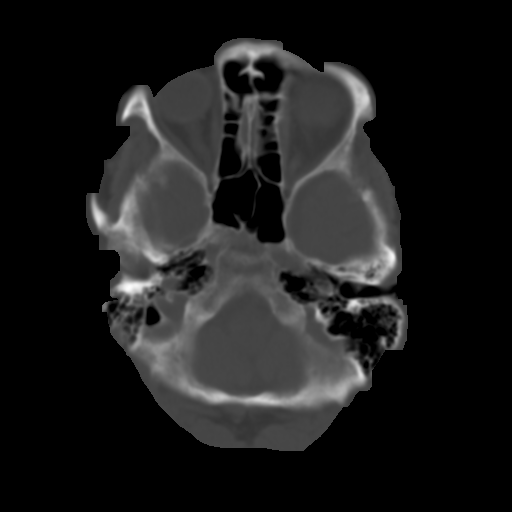
[im 6/29  brain]
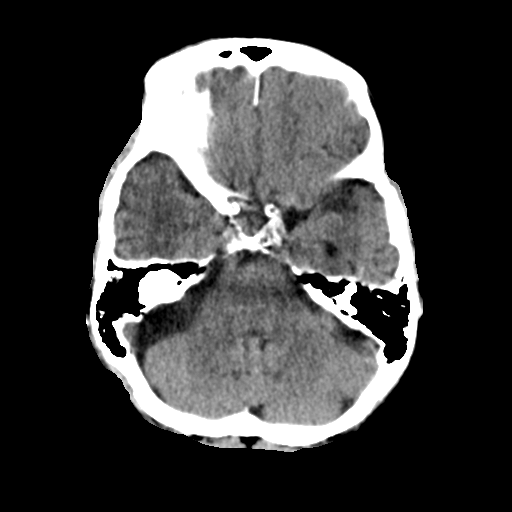
[im 8/29  brain]
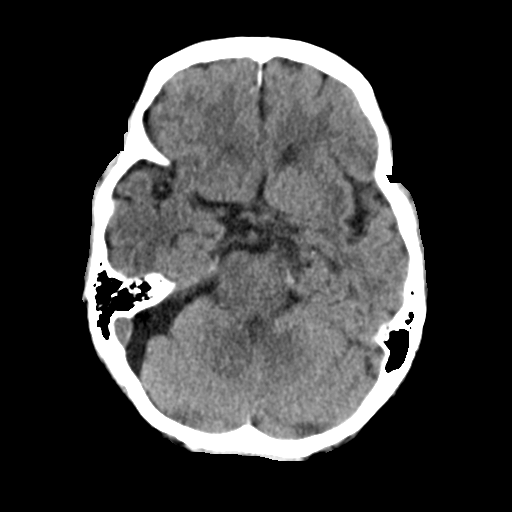
[im 11/29  brain]
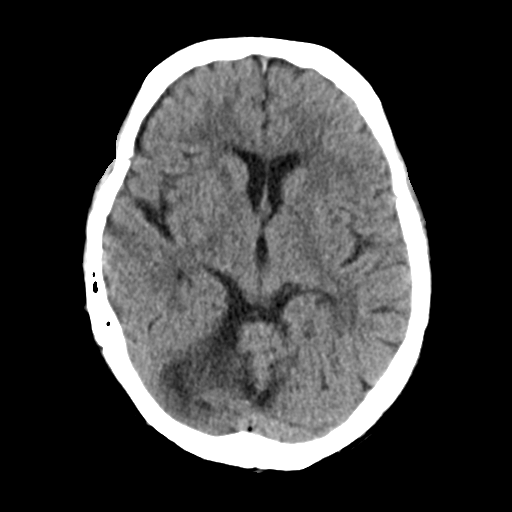
[im 14/29  brain]
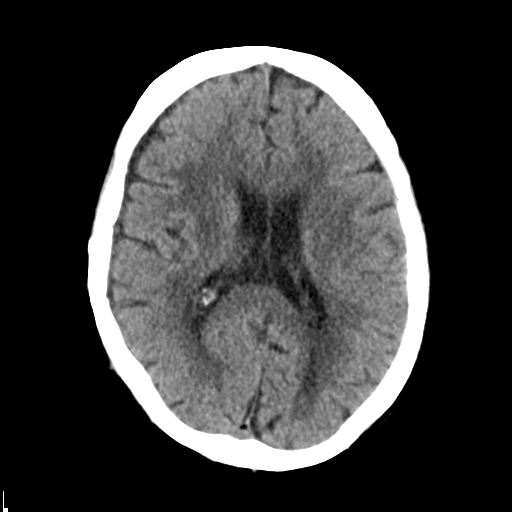
[im 14/29  bone]
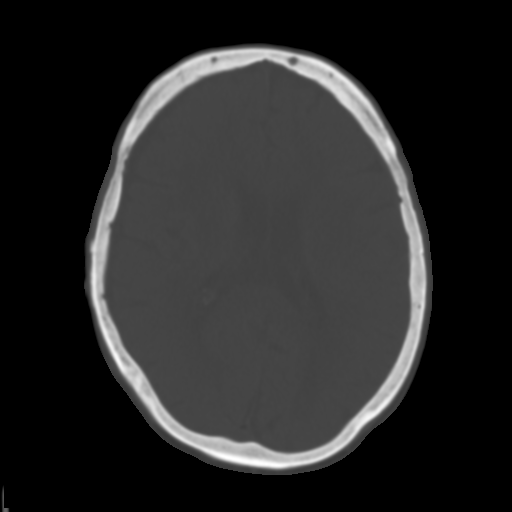
[im 16/29  brain]
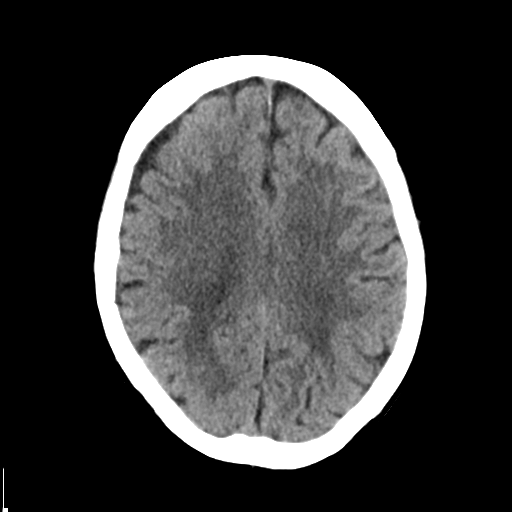
[im 19/29  brain]
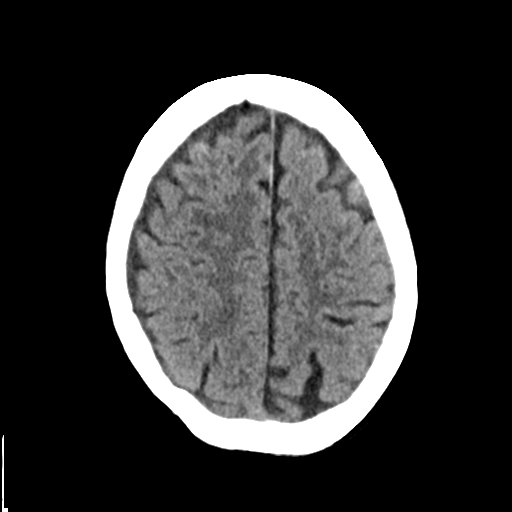
[im 22/29  brain]
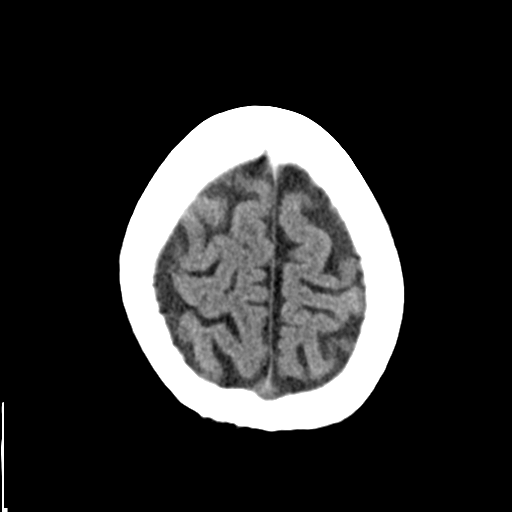
[im 24/29  brain]
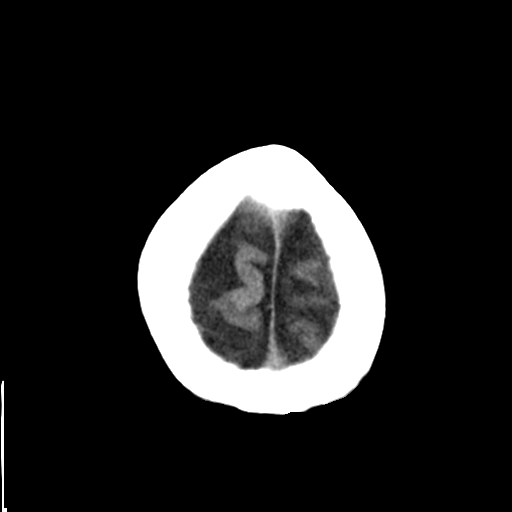
[im 24/29  bone]
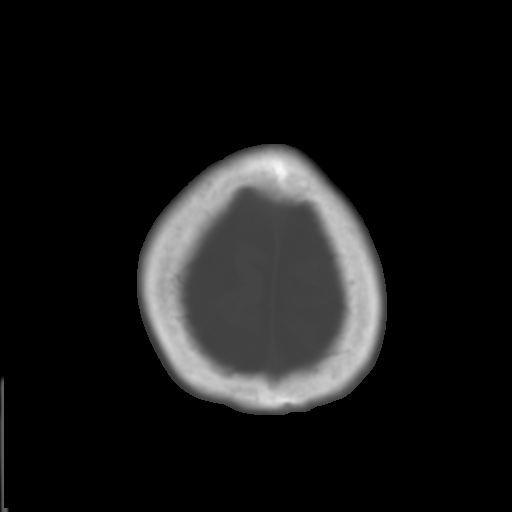
[im 27/29  brain]
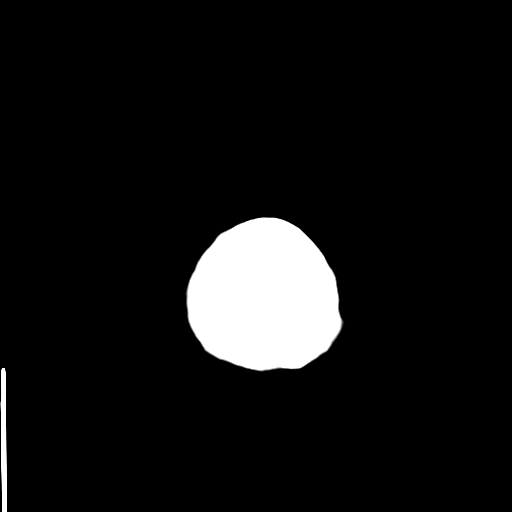

[Series 4: coronal soft tissue · coronal · 0.31mm/px · 3 of 65 slices shown]
[im 22/65  brain]
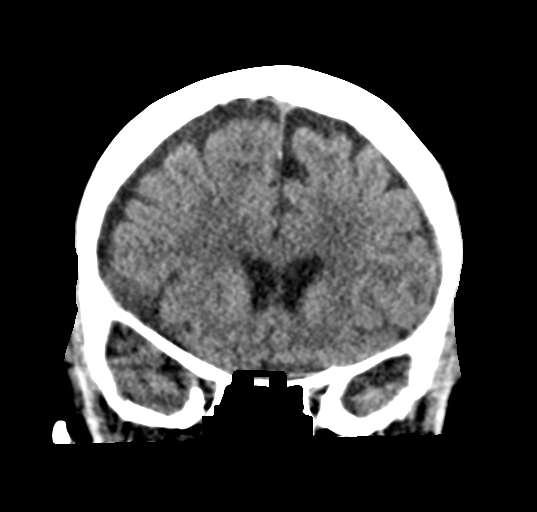
[im 29/65  brain]
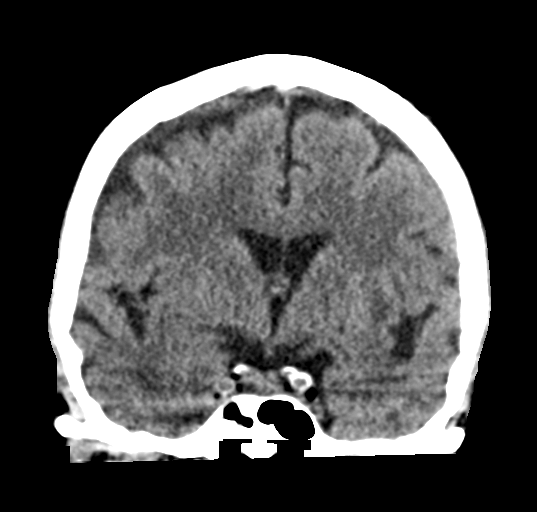
[im 36/65  brain]
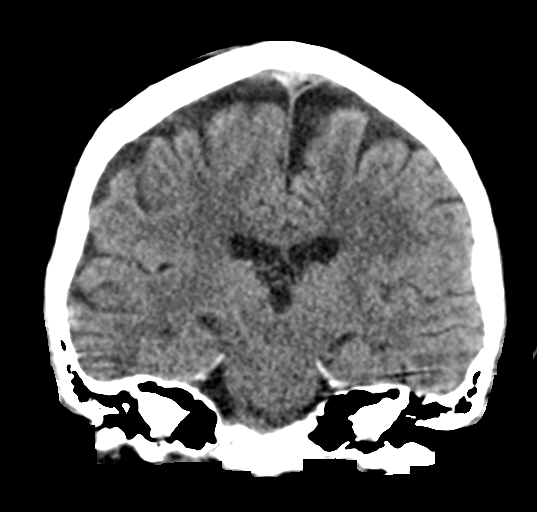

[Series 5: sagittal soft tissue · sagittal · 0.33mm/px · 3 of 56 slices shown]
[im 19/56  brain]
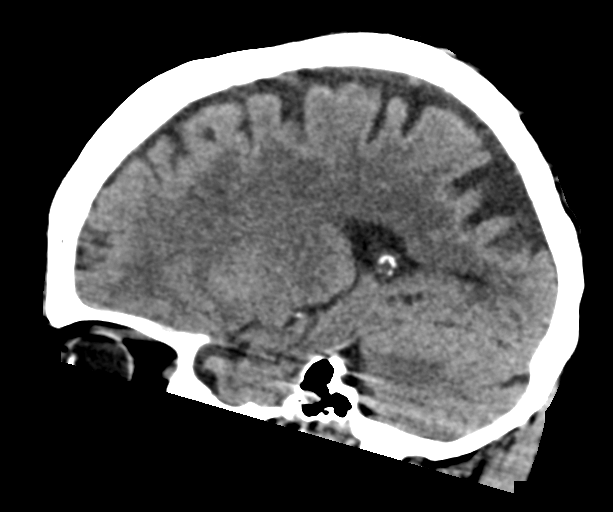
[im 28/56  brain]
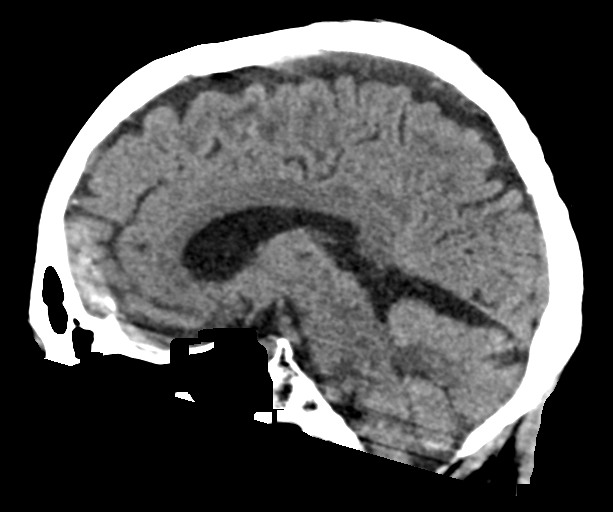
[im 37/56  brain]
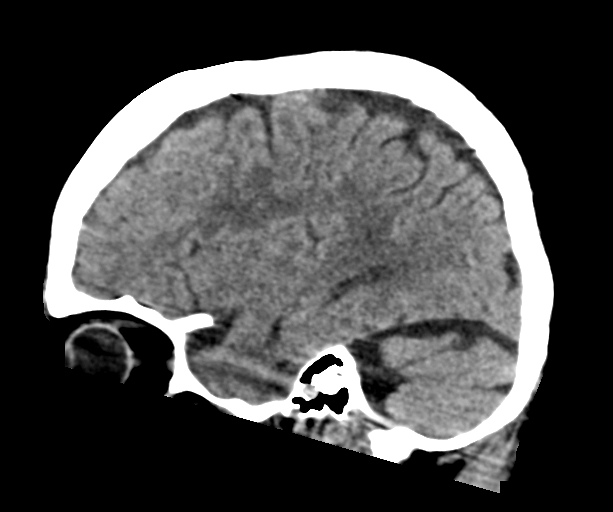

[16 of 46 positions shown; findings below may reference images not displayed]

FINDINGS: Brain: No acute territorial infarction, hemorrhage or intracranial
mass. Mildly enlarged right cranial convexity extra-axial CSF
density, asymmetric compared to the right. Prominent right posterior
fossa extra-axial CSF density. Normal ventricle size. Patchy
hypodensity in the white matter consistent with small-vessel
ischemic changes.

Vascular: No hyperdense vessels.  Carotid vascular calcification

Skull: Normal. Negative for fracture or focal lesion.

Sinuses/Orbits: No acute finding.

Other: None
IMPRESSION: 1. No definite CT evidence for acute intracranial abnormality.
2. Asymmetric right cranial convexity thin CSF extra-axial density,
possible chronic subdural effusion
3. Atrophy and small vessel ischemic changes of the white matter.

## 2020-01-08 IMAGING — CR DG CHEST 1V PORT
1 series · 1 of 1 positions shown · non-contrast
Comparison: None.

CLINICAL DATA: Generalized weakness and fatigue

EXAM:
PORTABLE CHEST 1 VIEW

[portable]
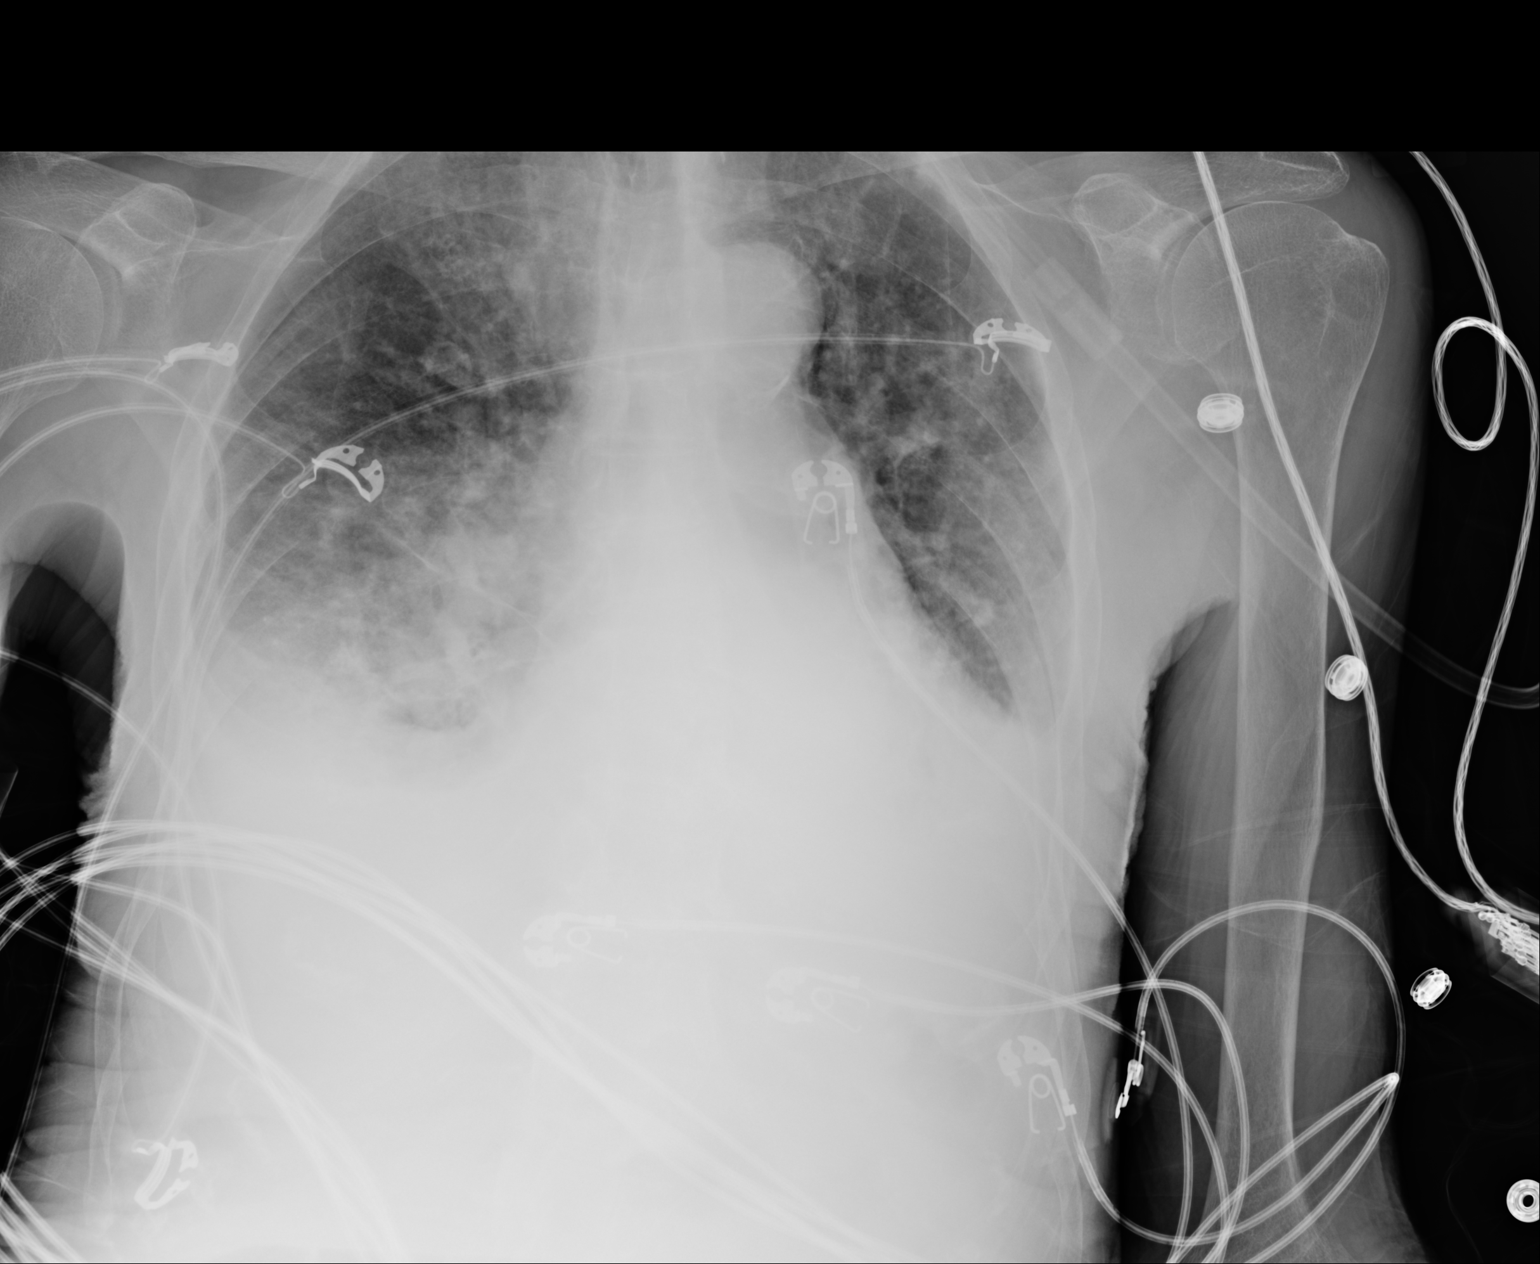

[1 of 1 positions shown; findings below may reference images not displayed]

FINDINGS: Small moderate pleural effusions with basilar consolidation.
Cardiomegaly with vascular congestion and moderate pulmonary edema.
Aortic atherosclerosis. No pneumothorax.
IMPRESSION: Cardiomegaly with vascular congestion, moderate pulmonary edema and
small moderate bilateral pleural effusions. Dense bibasilar
atelectasis or pneumonia.
